# Patient Record
Sex: Female | Born: 2005 | Race: White | Hispanic: No | Marital: Single | Smoking: Former smoker
Health system: Southern US, Community
[De-identification: ages and names within clinical notes are randomized; demographics above are authoritative.]

## PROBLEM LIST (undated history)

## (undated) DIAGNOSIS — J45909 Unspecified asthma, uncomplicated: Secondary | ICD-10-CM

## (undated) DIAGNOSIS — F319 Bipolar disorder, unspecified: Secondary | ICD-10-CM

## (undated) DIAGNOSIS — F32A Depression, unspecified: Secondary | ICD-10-CM

## (undated) DIAGNOSIS — F909 Attention-deficit hyperactivity disorder, unspecified type: Secondary | ICD-10-CM

## (undated) DIAGNOSIS — F419 Anxiety disorder, unspecified: Secondary | ICD-10-CM

## (undated) DIAGNOSIS — F329 Major depressive disorder, single episode, unspecified: Secondary | ICD-10-CM

## (undated) HISTORY — DX: Depression, unspecified: F32.A

## (undated) HISTORY — PX: TYMPANOSTOMY TUBE PLACEMENT: SHX32

## (undated) HISTORY — DX: Major depressive disorder, single episode, unspecified: F32.9

## (undated) HISTORY — PX: TONSILLECTOMY: SUR1361

## (undated) HISTORY — DX: Anxiety disorder, unspecified: F41.9

## (undated) HISTORY — DX: Attention-deficit hyperactivity disorder, unspecified type: F90.9

---
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
## 2023-12-23 ENCOUNTER — Encounter: Payer: Self-pay | Admitting: Plastic Surgery

## 2023-12-23 ENCOUNTER — Ambulatory Visit (INDEPENDENT_AMBULATORY_CARE_PROVIDER_SITE_OTHER): Admitting: Plastic Surgery

## 2023-12-23 VITALS — BP 120/81 | HR 78 | Ht 63.0 in | Wt 212.0 lb

## 2023-12-23 DIAGNOSIS — N62 Hypertrophy of breast: Secondary | ICD-10-CM

## 2023-12-23 DIAGNOSIS — M546 Pain in thoracic spine: Secondary | ICD-10-CM

## 2023-12-23 DIAGNOSIS — M542 Cervicalgia: Secondary | ICD-10-CM | POA: Diagnosis not present

## 2023-12-23 DIAGNOSIS — F4310 Post-traumatic stress disorder, unspecified: Secondary | ICD-10-CM

## 2023-12-23 DIAGNOSIS — Z6837 Body mass index (BMI) 37.0-37.9, adult: Secondary | ICD-10-CM

## 2023-12-23 DIAGNOSIS — G8929 Other chronic pain: Secondary | ICD-10-CM

## 2023-12-23 DIAGNOSIS — M549 Dorsalgia, unspecified: Secondary | ICD-10-CM | POA: Insufficient documentation

## 2023-12-23 NOTE — Progress Notes (Signed)
 Patient ID: Norma Brown, female    DOB: 02-06-2006, 18 y.o.   MRN: [id number]   Chief Complaint  Patient presents with   Consult   Breast Problem    Mammary Hyperplasia: The patient is a 18 y.o. female with a history of mammary hyperplasia for several years.  She has extremely large breasts causing symptoms that include the following: Back pain in the upper and lower back, including neck pain. She pulls or pins her bra straps to provide better lift and relief of the pressure and pain. She notices relief by holding her breast up manually.  Her shoulder straps cause grooves and pain and pressure that requires padding for relief. Pain medication is sometimes required with motrin and tylenol.  Activities that are hindered by enlarged breasts include: exercise and running.  She has tried supportive clothing as well as fitted bras without improvement.  Her breasts are extremely large and fairly symmetric.  She has hyperpigmentation of the inframammary area on both sides.  The sternal to nipple distance on the right is 38 cm and the left is 37 cm.  The IMF distance is 22 cm.  She is 5 feet 3 inches tall and weighs 212 pounds.  The BMI = 37.6 kg/m.  Preoperative bra size = F/G cup.  The estimated excess breast tissue to be removed at the time of surgery = 640 grams on the left and 640 grams on the right.  Mammogram history: none.  Family history of breast cancer:  none.  Tobacco use:  none.   The patient expresses the desire to pursue surgical intervention.  She has had tonsils and wisdom teeth removed without any difficulty.    Review of Systems  Constitutional:  Positive for activity change. Negative for appetite change.  HENT: Negative.    Eyes: Negative.   Respiratory: Negative.    Cardiovascular: Negative.   Gastrointestinal: Negative.   Endocrine: Negative.   Genitourinary: Negative.   Musculoskeletal:  Positive for back pain and neck pain.  Skin:  Positive for rash.    Past  Medical History:  Diagnosis Date   ADHD (attention deficit hyperactivity disorder)    Anxiety    Depression     Past Surgical History:  Procedure Laterality Date   TONSILLECTOMY     TYMPANOSTOMY TUBE PLACEMENT        Current Outpatient Medications:    busPIRone (BUSPAR) 10 MG tablet, Take 1 tablet (10 mg total) by mouth 3 (three) times daily., Disp: 90 tablet, Rfl: 2   citalopram (CELEXA) 20 MG tablet, Take 1.5 tablets (30 mg total) by mouth daily., Disp: 45 tablet, Rfl: 2   lamoTRIgine (LAMICTAL) 25 MG tablet, Take 2 tablets (50 mg total) by mouth at bedtime., Disp: 60 tablet, Rfl: 2   methylphenidate (CONCERTA) 54 MG PO CR tablet, Take 1 tablet (54 mg total) by mouth every morning., Disp: 30 tablet, Rfl: 0   Methylphenidate HCl ER 54 MG TB24, Take 54 mg by mouth every morning., Disp: 30 tablet, Rfl: 0   prazosin (MINIPRESS) 2 MG capsule, Take 1 capsule (2 mg total) by mouth at bedtime., Disp: 30 capsule, Rfl: 2   traZODone (DESYREL) 100 MG tablet, Take 1.5 tablets (150 mg total) by mouth at bedtime., Disp: 45 tablet, Rfl: 2   Objective:   There were no vitals filed for this visit.  Physical Exam Vitals and nursing note reviewed.  Constitutional:      Appearance: Normal appearance.  HENT:  Head: Atraumatic.  Cardiovascular:     Rate and Rhythm: Normal rate.     Pulses: Normal pulses.  Pulmonary:     Effort: Pulmonary effort is normal.  Abdominal:     General: There is no distension.     Palpations: Abdomen is soft.  Musculoskeletal:        General: No swelling or tenderness.  Skin:    General: Skin is warm.     Capillary Refill: Capillary refill takes less than 2 seconds.     Coloration: Skin is not jaundiced.  Neurological:     Mental Status: She is alert and oriented to person, place, and time.  Psychiatric:        Mood and Affect: Mood normal.        Behavior: Behavior normal.        Thought Content: Thought content normal.        Judgment: Judgment normal.      Assessment & Plan:  PTSD (post-traumatic stress disorder)  Symptomatic mammary hypertrophy  Chronic bilateral thoracic back pain  Neck pain  The procedure the patient selected and that was best for the patient was discussed. The risk were discussed and include but not limited to the following:  Breast asymmetry, fluid accumulation, firmness of the breast, inability to breast feed, loss of nipple or areola, skin loss, change in skin and nipple sensation, fat necrosis of the breast tissue, bleeding, infection and healing delay.  There are risks of anesthesia and injury to nerves or blood vessels.  Allergic reaction to tape, suture and skin glue are possible.  There will be swelling.  Any of these can lead to the need for revisional surgery which is not included in this surgery.  A breast reduction has potential to interfere with diagnostic procedures in the future.  This procedure is best done when the breast is fully developed.  Changes in the breast will continue to occur over time: pregnancy, weight gain or weigh loss. No guarantees are given for a certain bra or breast size.    Total time: 40 minutes. This includes time spent with the patient during the visit as well as time spent before and after the visit reviewing the chart, documenting the encounter, ordering pertinent studies and literature for the patient.   Physical therapy: Not required Mammogram: Not required  The patient is a good candidate for bilateral breast reduction with liposuction.  Pictures were obtained of the patient and placed in the chart with the patient's or guardian's permission.   Lindaann Requena Jax Kentner, DO

## 2024-01-28 ENCOUNTER — Institutional Professional Consult (permissible substitution): Payer: Self-pay | Admitting: Plastic Surgery

## 2024-01-30 ENCOUNTER — Encounter: Payer: Self-pay | Admitting: Surgical

## 2024-01-30 ENCOUNTER — Ambulatory Visit (INDEPENDENT_AMBULATORY_CARE_PROVIDER_SITE_OTHER): Admitting: Surgical

## 2024-01-30 VITALS — BP 118/78 | HR 72 | Wt 207.0 lb

## 2024-01-30 DIAGNOSIS — M546 Pain in thoracic spine: Secondary | ICD-10-CM

## 2024-01-30 DIAGNOSIS — M542 Cervicalgia: Secondary | ICD-10-CM

## 2024-01-30 DIAGNOSIS — N62 Hypertrophy of breast: Secondary | ICD-10-CM

## 2024-01-30 DIAGNOSIS — G8929 Other chronic pain: Secondary | ICD-10-CM

## 2024-01-30 NOTE — Progress Notes (Signed)
 Patient ID: Norma Brown, female    DOB: 10-17-2005, 18 y.o.   MRN: [id number]  Chief Complaint  Patient presents with   Pre-op Exam      ICD-10-CM   1. Symptomatic mammary hypertrophy  N62     2. Chronic bilateral thoracic back pain  M54.6    G89.29     3. Neck pain  M54.2      History of Present Illness: Norma Brown is a 18 y.o.  female  with a history of macromastia.  She presents for preoperative evaluation for upcoming procedure, Bilateral Breast Reduction with possible liposuction, scheduled for 02/13/2024 with Dr.  Orin Birk  The patient has not had problems with anesthesia.  She has had dental procedures in the past including wisdom teeth removal and tonsillectomy.  She reports she tolerated these well without any issues.  She is not aware of her family history, she does not know any family history related to blood clots or clotting disorders.  She does not have any personal history of blood clots or clotting disorders that she is aware of.  She is not on any OCPs.  She denies any Crohn's or ulcerative colitis.  She does not report any recent changes to her health.  She denies any cardiac or pulmonary diseases.  Summary of Previous Visit: STN 38 cm on the right, 37 cm on the left.  F/G cup.  Estimated excess breast tissue to be removed at time of surgery: 640 grams  Job: She works at [employer], planning 2 weeks out of work in the return with lifting restrictions.  PMH Significant for: Symptomatic mammary hypertrophy.   Patient reports is not taking any medications at this time, reports she occasionally takes Tylenol or ibuprofen for headaches.  She does report that she smokes marijuana occasionally.  She also reports that she does vape occasionally, reports that she does not vape regularly, but when around others she will occasionally use their vapes.  She reports last use was about 5 days ago.  But denies any regular use.  She reports she is about an F cup and  would like to be much smaller, about a D cup.  She is aware that we cannot guarantee a cup size.  She denies any recent changes to her health.  She has been feeling well lately.  She is here with her aunt who will be assisting her with care afterwards.   Past Medical History: Allergies: Allergies  Allergen Reactions   Acetaminophen-Caff-Pyrilamine Hives   Tape Rash    Surgical tape    Current Medications: Patient reports that she is not taking any current medications.  Past Medical Problems: Past Medical History:  Diagnosis Date   ADHD (attention deficit hyperactivity disorder)    Anxiety    Depression     Past Surgical History: Past Surgical History:  Procedure Laterality Date   TONSILLECTOMY     TYMPANOSTOMY TUBE PLACEMENT      Social History: Social History   Socioeconomic History   Marital status: Single    Spouse name: Not on file   Number of children: Not on file   Years of education: Not on file   Highest education level: Not on file  Occupational History   Not on file  Tobacco Use   Smoking status: Never   Smokeless tobacco: Never  Substance and Sexual Activity   Alcohol use: Not on file   Drug use: Never   Sexual activity: Not  Currently  Other Topics Concern   Not on file  Social History Narrative   Not on file   Social Drivers of Health   Financial Resource Strain: Not on file  Food Insecurity: Not on file  Transportation Needs: Not on file  Physical Activity: Not on file  Stress: Not on file  Social Connections: Not on file  Intimate Partner Violence: Not on file    Family History: Family History  Problem Relation Age of Onset   Drug abuse Mother    Bipolar disorder Mother    Drug abuse Father    Depression Maternal Uncle    Anxiety disorder Cousin    Anxiety disorder Cousin     Review of Systems: Review of Systems  Constitutional: Negative.   Respiratory: Negative.    Cardiovascular: Negative.   Gastrointestinal: Negative.    Musculoskeletal:  Positive for back pain.    Physical Exam: Vital Signs BP 118/78 (BP Location: Left Arm, Patient Position: Sitting, Cuff Size: Normal)   Pulse 72   Wt 207 lb (93.9 kg)   SpO2 100%   BMI 36.67 kg/m   Physical Exam Constitutional:      General: Not in acute distress.    Appearance: Normal appearance. Not ill-appearing.  HENT:     Head: Normocephalic and atraumatic.  Eyes:     Pupils: Pupils are equal, round Neck:     Musculoskeletal: Normal range of motion.  Cardiovascular:     Rate and Rhythm: Normal rate    Pulses: Normal pulses.  Pulmonary:     Effort: Pulmonary effort is normal. No respiratory distress.  Musculoskeletal: Normal range of motion.  Skin:    General: Skin is warm and dry.     Findings: No erythema or rash.  Neurological:     General: No focal deficit present.     Mental Status: Alert and oriented to person, place, and time. Mental status is at baseline.     Motor: No weakness.  Psychiatric:        Mood and Affect: Mood normal.        Behavior: Behavior normal.    Assessment/Plan: The patient is scheduled for bilateral breast reduction with Dr. Orin Birk.  Risks, benefits, and alternatives of procedure discussed, questions answered and consent obtained.    Smoking Status: Reports marijuana use, reports occasional vaping, but is willing to stop vaping between now and surgery and also stop using marijuana; Counseling Given?  Discussed increased risk of postoperative complications related to use of nicotine. Last Mammogram: no hx due to age; Results: n/a  Caprini Score: 4 moderate; Risk Factors include: BMI > 25, use of marijuana, and length of planned surgery. Recommendation for mechanical prophylaxis. Encourage early ambulation.   Pictures obtained: @consult   Post-op Rx sent to pharmacy: No prescription sent at this time, will notify Dr. Orin Birk of patient's nicotine use, may need to be postponed.  Patient was provided with the  breast reduction and General Surgical Risk consent document and Pain Medication Agreement prior to their appointment.  They had adequate time to read through the risk consent documents and Pain Medication Agreement. We also discussed them in person together during this preop appointment. All of their questions were answered to their satisfaction.  Recommended calling if they have any further questions.  Risk consent form and Pain Medication Agreement to be scanned into patient's chart.  The risk that can be encountered with breast reduction were discussed and include the following but not limited to these:  Breast asymmetry, fluid accumulation, firmness of the breast, inability to breast feed, loss of nipple or areola, skin loss, decrease or no nipple sensation, fat necrosis of the breast tissue, bleeding, infection, healing delay.  There are risks of anesthesia, changes to skin sensation and injury to nerves or blood vessels.  The muscle can be temporarily or permanently injured.  You may have an allergic reaction to tape, suture, glue, blood products which can result in skin discoloration, swelling, pain, skin lesions, poor healing.  Any of these can lead to the need for revisonal surgery or stage procedures.  A reduction has potential to interfere with diagnostic procedures.  Nipple or breast piercing can increase risks of infection.  This procedure is best done when the breast is fully developed.  Changes in the breast will continue to occur over time.  Pregnancy can alter the outcomes of previous breast reduction surgery, weight gain and weigh loss can also effect the long term appearance.   We discussed the possibility of amputation/free nipple graft technique due to the length of her STN.  She is understanding of the possibility that we would need to transition from a pedicle technique to a free nipple graft technique intraoperatively.  We discussed the risks associated with free nipple graft breast  reductions, including but not limited to failure of the graft, partial loss of the graft, loss of sensation of bilateral nipple areola, complete loss of the nipple areola graft, inability to breast-feed, postoperative wounds, ongoing wound care.  We also discussed the risks associated with the pedicle technique.  We discussed that with the pedicle technique she could develop nipple areolar necrosis which would result in loss of the nipple, this would also result in ongoing wound care and possible changes in the shape of her breast.   Electronically signed by: Janalyn Me Clarabelle Oscarson, PA-C 01/30/2024 10:50 AM

## 2024-01-30 NOTE — H&P (View-Only) (Signed)
 Patient ID: Norma Brown, female    DOB: 10-17-2005, 18 y.o.   MRN: [id number]  Chief Complaint  Patient presents with   Pre-op Exam      ICD-10-CM   1. Symptomatic mammary hypertrophy  N62     2. Chronic bilateral thoracic back pain  M54.6    G89.29     3. Neck pain  M54.2      History of Present Illness: Norma Brown is a 18 y.o.  female  with a history of macromastia.  She presents for preoperative evaluation for upcoming procedure, Bilateral Breast Reduction with possible liposuction, scheduled for 02/13/2024 with Dr.  Orin Birk  The patient has not had problems with anesthesia.  She has had dental procedures in the past including wisdom teeth removal and tonsillectomy.  She reports she tolerated these well without any issues.  She is not aware of her family history, she does not know any family history related to blood clots or clotting disorders.  She does not have any personal history of blood clots or clotting disorders that she is aware of.  She is not on any OCPs.  She denies any Crohn's or ulcerative colitis.  She does not report any recent changes to her health.  She denies any cardiac or pulmonary diseases.  Summary of Previous Visit: STN 38 cm on the right, 37 cm on the left.  F/G cup.  Estimated excess breast tissue to be removed at time of surgery: 640 grams  Job: She works at [employer], planning 2 weeks out of work in the return with lifting restrictions.  PMH Significant for: Symptomatic mammary hypertrophy.   Patient reports is not taking any medications at this time, reports she occasionally takes Tylenol or ibuprofen for headaches.  She does report that she smokes marijuana occasionally.  She also reports that she does vape occasionally, reports that she does not vape regularly, but when around others she will occasionally use their vapes.  She reports last use was about 5 days ago.  But denies any regular use.  She reports she is about an F cup and  would like to be much smaller, about a D cup.  She is aware that we cannot guarantee a cup size.  She denies any recent changes to her health.  She has been feeling well lately.  She is here with her aunt who will be assisting her with care afterwards.   Past Medical History: Allergies: Allergies  Allergen Reactions   Acetaminophen-Caff-Pyrilamine Hives   Tape Rash    Surgical tape    Current Medications: Patient reports that she is not taking any current medications.  Past Medical Problems: Past Medical History:  Diagnosis Date   ADHD (attention deficit hyperactivity disorder)    Anxiety    Depression     Past Surgical History: Past Surgical History:  Procedure Laterality Date   TONSILLECTOMY     TYMPANOSTOMY TUBE PLACEMENT      Social History: Social History   Socioeconomic History   Marital status: Single    Spouse name: Not on file   Number of children: Not on file   Years of education: Not on file   Highest education level: Not on file  Occupational History   Not on file  Tobacco Use   Smoking status: Never   Smokeless tobacco: Never  Substance and Sexual Activity   Alcohol use: Not on file   Drug use: Never   Sexual activity: Not  Currently  Other Topics Concern   Not on file  Social History Narrative   Not on file   Social Drivers of Health   Financial Resource Strain: Not on file  Food Insecurity: Not on file  Transportation Needs: Not on file  Physical Activity: Not on file  Stress: Not on file  Social Connections: Not on file  Intimate Partner Violence: Not on file    Family History: Family History  Problem Relation Age of Onset   Drug abuse Mother    Bipolar disorder Mother    Drug abuse Father    Depression Maternal Uncle    Anxiety disorder Cousin    Anxiety disorder Cousin     Review of Systems: Review of Systems  Constitutional: Negative.   Respiratory: Negative.    Cardiovascular: Negative.   Gastrointestinal: Negative.    Musculoskeletal:  Positive for back pain.    Physical Exam: Vital Signs BP 118/78 (BP Location: Left Arm, Patient Position: Sitting, Cuff Size: Normal)   Pulse 72   Wt 207 lb (93.9 kg)   SpO2 100%   BMI 36.67 kg/m   Physical Exam Constitutional:      General: Not in acute distress.    Appearance: Normal appearance. Not ill-appearing.  HENT:     Head: Normocephalic and atraumatic.  Eyes:     Pupils: Pupils are equal, round Neck:     Musculoskeletal: Normal range of motion.  Cardiovascular:     Rate and Rhythm: Normal rate    Pulses: Normal pulses.  Pulmonary:     Effort: Pulmonary effort is normal. No respiratory distress.  Musculoskeletal: Normal range of motion.  Skin:    General: Skin is warm and dry.     Findings: No erythema or rash.  Neurological:     General: No focal deficit present.     Mental Status: Alert and oriented to person, place, and time. Mental status is at baseline.     Motor: No weakness.  Psychiatric:        Mood and Affect: Mood normal.        Behavior: Behavior normal.    Assessment/Plan: The patient is scheduled for bilateral breast reduction with Dr. Orin Birk.  Risks, benefits, and alternatives of procedure discussed, questions answered and consent obtained.    Smoking Status: Reports marijuana use, reports occasional vaping, but is willing to stop vaping between now and surgery and also stop using marijuana; Counseling Given?  Discussed increased risk of postoperative complications related to use of nicotine. Last Mammogram: no hx due to age; Results: n/a  Caprini Score: 4 moderate; Risk Factors include: BMI > 25, use of marijuana, and length of planned surgery. Recommendation for mechanical prophylaxis. Encourage early ambulation.   Pictures obtained: @consult   Post-op Rx sent to pharmacy: No prescription sent at this time, will notify Dr. Orin Birk of patient's nicotine use, may need to be postponed.  Patient was provided with the  breast reduction and General Surgical Risk consent document and Pain Medication Agreement prior to their appointment.  They had adequate time to read through the risk consent documents and Pain Medication Agreement. We also discussed them in person together during this preop appointment. All of their questions were answered to their satisfaction.  Recommended calling if they have any further questions.  Risk consent form and Pain Medication Agreement to be scanned into patient's chart.  The risk that can be encountered with breast reduction were discussed and include the following but not limited to these:  Breast asymmetry, fluid accumulation, firmness of the breast, inability to breast feed, loss of nipple or areola, skin loss, decrease or no nipple sensation, fat necrosis of the breast tissue, bleeding, infection, healing delay.  There are risks of anesthesia, changes to skin sensation and injury to nerves or blood vessels.  The muscle can be temporarily or permanently injured.  You may have an allergic reaction to tape, suture, glue, blood products which can result in skin discoloration, swelling, pain, skin lesions, poor healing.  Any of these can lead to the need for revisonal surgery or stage procedures.  A reduction has potential to interfere with diagnostic procedures.  Nipple or breast piercing can increase risks of infection.  This procedure is best done when the breast is fully developed.  Changes in the breast will continue to occur over time.  Pregnancy can alter the outcomes of previous breast reduction surgery, weight gain and weigh loss can also effect the long term appearance.   We discussed the possibility of amputation/free nipple graft technique due to the length of her STN.  She is understanding of the possibility that we would need to transition from a pedicle technique to a free nipple graft technique intraoperatively.  We discussed the risks associated with free nipple graft breast  reductions, including but not limited to failure of the graft, partial loss of the graft, loss of sensation of bilateral nipple areola, complete loss of the nipple areola graft, inability to breast-feed, postoperative wounds, ongoing wound care.  We also discussed the risks associated with the pedicle technique.  We discussed that with the pedicle technique she could develop nipple areolar necrosis which would result in loss of the nipple, this would also result in ongoing wound care and possible changes in the shape of her breast.   Electronically signed by: Janalyn Me Clarabelle Oscarson, PA-C 01/30/2024 10:50 AM

## 2024-02-07 ENCOUNTER — Other Ambulatory Visit: Payer: Self-pay

## 2024-02-07 ENCOUNTER — Encounter (HOSPITAL_BASED_OUTPATIENT_CLINIC_OR_DEPARTMENT_OTHER): Payer: Self-pay | Admitting: Plastic Surgery

## 2024-02-12 NOTE — Anesthesia Preprocedure Evaluation (Addendum)
 Anesthesia Evaluation  Patient identified by MRN, date of birth, ID band Patient awake    Reviewed: Allergy & Precautions, NPO status , Patient's Chart, lab work & pertinent test results  History of Anesthesia Complications Negative for: history of anesthetic complications  Airway Mallampati: II  TM Distance: >3 FB Neck ROM: Full    Dental  (+) Dental Advisory Given   Pulmonary neg pulmonary ROS   breath sounds clear to auscultation       Cardiovascular negative cardio ROS  Rhythm:Regular Rate:Normal     Neuro/Psych  PSYCHIATRIC DISORDERS (ADHD) Anxiety Depression Bipolar Disorder   negative neurological ROS     GI/Hepatic negative GI ROS, Neg liver ROS,,,  Endo/Other  negative endocrine ROS    Renal/GU negative Renal ROS     Musculoskeletal   Abdominal  (+) + obese  Peds  Hematology negative hematology ROS (+)   Anesthesia Other Findings   Reproductive/Obstetrics                             Anesthesia Physical Anesthesia Plan  ASA: 2  Anesthesia Plan: General   Post-op Pain Management:    Induction: Intravenous  PONV Risk Score and Plan: 4 or greater and Ondansetron, Dexamethasone, Midazolam, Scopolamine patch - Pre-op and Treatment may vary due to age or medical condition  Airway Management Planned: Oral ETT  Additional Equipment:   Intra-op Plan:   Post-operative Plan: Extubation in OR  Informed Consent: I have reviewed the patients History and Physical, chart, labs and discussed the procedure including the risks, benefits and alternatives for the proposed anesthesia with the patient or authorized representative who has indicated his/her understanding and acceptance.     Dental advisory given  Plan Discussed with: CRNA and Anesthesiologist  Anesthesia Plan Comments: (Risks of general anesthesia discussed including, but not limited to, sore throat, hoarse voice,  chipped/damaged teeth, injury to vocal cords, nausea and vomiting, allergic reactions, lung infection, heart attack, stroke, and death. All questions answered. )        Anesthesia Quick Evaluation

## 2024-02-13 ENCOUNTER — Other Ambulatory Visit: Payer: Self-pay

## 2024-02-13 ENCOUNTER — Ambulatory Visit (HOSPITAL_BASED_OUTPATIENT_CLINIC_OR_DEPARTMENT_OTHER)
Admission: RE | Admit: 2024-02-13 | Discharge: 2024-02-13 | Disposition: A | Discharge status: Home / Self Care | Attending: Plastic Surgery | Admitting: Plastic Surgery

## 2024-02-13 ENCOUNTER — Ambulatory Visit (HOSPITAL_BASED_OUTPATIENT_CLINIC_OR_DEPARTMENT_OTHER): Payer: Self-pay | Admitting: Anesthesiology

## 2024-02-13 ENCOUNTER — Encounter (HOSPITAL_BASED_OUTPATIENT_CLINIC_OR_DEPARTMENT_OTHER): Payer: Self-pay | Admitting: Plastic Surgery

## 2024-02-13 ENCOUNTER — Encounter (HOSPITAL_BASED_OUTPATIENT_CLINIC_OR_DEPARTMENT_OTHER)
Admission: RE | Disposition: A | Discharge status: Home / Self Care | Payer: Self-pay | Source: Home / Self Care | Attending: Plastic Surgery

## 2024-02-13 DIAGNOSIS — M549 Dorsalgia, unspecified: Secondary | ICD-10-CM | POA: Diagnosis not present

## 2024-02-13 DIAGNOSIS — N62 Hypertrophy of breast: Secondary | ICD-10-CM

## 2024-02-13 DIAGNOSIS — Z01818 Encounter for other preprocedural examination: Secondary | ICD-10-CM

## 2024-02-13 DIAGNOSIS — M542 Cervicalgia: Secondary | ICD-10-CM | POA: Insufficient documentation

## 2024-02-13 HISTORY — DX: Bipolar disorder, unspecified: F31.9

## 2024-02-13 HISTORY — PX: BREAST REDUCTION SURGERY: SHX8

## 2024-02-13 LAB — POCT PREGNANCY, URINE: Preg Test, Ur: NEGATIVE

## 2024-02-13 SURGERY — BREAST REDUCTION WITH LIPOSUCTION
Anesthesia: General | Site: Breast | Laterality: Bilateral

## 2024-02-13 MED ORDER — DEXMEDETOMIDINE HCL IN NACL 80 MCG/20ML IV SOLN
INTRAVENOUS | Status: AC
  Filled 2024-02-13: qty 20

## 2024-02-13 MED ORDER — OXYCODONE HCL 5 MG PO TABS: 5.0000 mg | ORAL_TABLET | ORAL | Status: DC | PRN

## 2024-02-13 MED ORDER — FENTANYL CITRATE (PF) 100 MCG/2ML IJ SOLN
INTRAMUSCULAR | Status: AC
  Filled 2024-02-13: qty 2

## 2024-02-13 MED ORDER — OXYCODONE HCL 5 MG PO TABS: 5.0000 mg | ORAL_TABLET | Freq: Three times a day (TID) | ORAL | 0 refills | Status: AC | PRN

## 2024-02-13 MED ORDER — EPHEDRINE 5 MG/ML INJ
INTRAVENOUS | Status: AC
  Filled 2024-02-13: qty 5

## 2024-02-13 MED ORDER — FENTANYL CITRATE (PF) 100 MCG/2ML IJ SOLN: 25.0000 ug | INTRAMUSCULAR | Status: DC | PRN

## 2024-02-13 MED ORDER — CHLORHEXIDINE GLUCONATE CLOTH 2 % EX PADS: 6.0000 | MEDICATED_PAD | Freq: Once | CUTANEOUS | Status: DC

## 2024-02-13 MED ORDER — SODIUM CHLORIDE 0.9 % IV SOLN
INTRAVENOUS | Status: DC | PRN
  Administered 2024-02-13: 40 mL

## 2024-02-13 MED ORDER — LIDOCAINE 2% (20 MG/ML) 5 ML SYRINGE
INTRAMUSCULAR | Status: AC
  Filled 2024-02-13: qty 5

## 2024-02-13 MED ORDER — VASHE WOUND IRRIGATION OPTIME
TOPICAL | Status: DC | PRN
Start: 2024-02-13 — End: 2024-02-13
  Administered 2024-02-13: 34 [oz_av] via TOPICAL

## 2024-02-13 MED ORDER — DEXMEDETOMIDINE HCL IN NACL 80 MCG/20ML IV SOLN
INTRAVENOUS | Status: DC | PRN
  Administered 2024-02-13: 16 ug via INTRAVENOUS

## 2024-02-13 MED ORDER — BUPIVACAINE LIPOSOME 1.3 % IJ SUSP
INTRAMUSCULAR | Status: AC
  Filled 2024-02-13: qty 20

## 2024-02-13 MED ORDER — ATROPINE SULFATE 0.4 MG/ML IV SOLN
INTRAVENOUS | Status: AC
  Filled 2024-02-13: qty 1

## 2024-02-13 MED ORDER — ONDANSETRON HCL 4 MG/2ML IJ SOLN
INTRAMUSCULAR | Status: AC
  Filled 2024-02-13: qty 2

## 2024-02-13 MED ORDER — AMISULPRIDE (ANTIEMETIC) 5 MG/2ML IV SOLN
INTRAVENOUS | Status: AC
  Filled 2024-02-13: qty 2

## 2024-02-13 MED ORDER — HYDROMORPHONE HCL 1 MG/ML IJ SOLN
INTRAMUSCULAR | Status: DC | PRN
  Administered 2024-02-13 (×2): .5 mg via INTRAVENOUS

## 2024-02-13 MED ORDER — BUPIVACAINE HCL (PF) 0.25 % IJ SOLN
INTRAMUSCULAR | Status: AC
  Filled 2024-02-13: qty 30

## 2024-02-13 MED ORDER — CEFAZOLIN SODIUM-DEXTROSE 2-4 GM/100ML-% IV SOLN
2.0000 g | INTRAVENOUS | Status: AC
  Administered 2024-02-13: 2 g via INTRAVENOUS

## 2024-02-13 MED ORDER — HYDROMORPHONE HCL 1 MG/ML IJ SOLN
INTRAMUSCULAR | Status: AC
  Filled 2024-02-13: qty 0.5

## 2024-02-13 MED ORDER — OXYCODONE HCL 5 MG PO TABS: 5.0000 mg | ORAL_TABLET | Freq: Once | ORAL | Status: DC | PRN

## 2024-02-13 MED ORDER — SUGAMMADEX SODIUM 200 MG/2ML IV SOLN
INTRAVENOUS | Status: DC | PRN
  Administered 2024-02-13: 200 mg via INTRAVENOUS

## 2024-02-13 MED ORDER — PROPOFOL 10 MG/ML IV BOLUS
INTRAVENOUS | Status: DC | PRN
  Administered 2024-02-13: 200 mg via INTRAVENOUS

## 2024-02-13 MED ORDER — OXYCODONE HCL 5 MG/5ML PO SOLN: 5.0000 mg | Freq: Once | ORAL | Status: DC | PRN

## 2024-02-13 MED ORDER — ROCURONIUM BROMIDE 10 MG/ML (PF) SYRINGE
PREFILLED_SYRINGE | INTRAVENOUS | Status: AC
  Filled 2024-02-13: qty 10

## 2024-02-13 MED ORDER — ONDANSETRON 4 MG PO TBDP: 4.0000 mg | ORAL_TABLET | Freq: Three times a day (TID) | ORAL | 0 refills | Status: AC | PRN

## 2024-02-13 MED ORDER — SODIUM CHLORIDE 0.9% FLUSH: 3.0000 mL | Freq: Two times a day (BID) | INTRAVENOUS | Status: DC

## 2024-02-13 MED ORDER — SODIUM CHLORIDE 0.9% FLUSH: 3.0000 mL | INTRAVENOUS | Status: DC | PRN

## 2024-02-13 MED ORDER — PHENYLEPHRINE 80 MCG/ML (10ML) SYRINGE FOR IV PUSH (FOR BLOOD PRESSURE SUPPORT)
PREFILLED_SYRINGE | INTRAVENOUS | Status: AC
  Filled 2024-02-13: qty 10

## 2024-02-13 MED ORDER — LIDOCAINE HCL 1 % IJ SOLN
INTRAVENOUS | Status: DC | PRN
  Administered 2024-02-13: 500 mL

## 2024-02-13 MED ORDER — MIDAZOLAM HCL 2 MG/2ML IJ SOLN
INTRAMUSCULAR | Status: AC
  Filled 2024-02-13: qty 2

## 2024-02-13 MED ORDER — CEFAZOLIN SODIUM-DEXTROSE 2-4 GM/100ML-% IV SOLN
INTRAVENOUS | Status: AC
  Filled 2024-02-13: qty 100

## 2024-02-13 MED ORDER — LIDOCAINE HCL (CARDIAC) PF 100 MG/5ML IV SOSY
PREFILLED_SYRINGE | INTRAVENOUS | Status: DC | PRN
  Administered 2024-02-13: 100 mg via INTRAVENOUS

## 2024-02-13 MED ORDER — LIDOCAINE-EPINEPHRINE 1 %-1:100000 IJ SOLN
INTRAMUSCULAR | Status: DC | PRN
  Administered 2024-02-13: 50 mL via INTRAMUSCULAR

## 2024-02-13 MED ORDER — LACTATED RINGERS IV SOLN: INTRAVENOUS | Status: DC

## 2024-02-13 MED ORDER — ROCURONIUM BROMIDE 100 MG/10ML IV SOLN
INTRAVENOUS | Status: DC | PRN
  Administered 2024-02-13: 60 mg via INTRAVENOUS

## 2024-02-13 MED ORDER — DEXAMETHASONE SODIUM PHOSPHATE 4 MG/ML IJ SOLN
INTRAMUSCULAR | Status: DC | PRN
  Administered 2024-02-13: 5 mg via INTRAVENOUS

## 2024-02-13 MED ORDER — SODIUM CHLORIDE 0.9 % IV SOLN: 250.0000 mL | INTRAVENOUS | Status: DC | PRN

## 2024-02-13 MED ORDER — SUCCINYLCHOLINE CHLORIDE 200 MG/10ML IV SOSY
PREFILLED_SYRINGE | INTRAVENOUS | Status: AC
  Filled 2024-02-13: qty 10

## 2024-02-13 MED ORDER — SCOPOLAMINE 1 MG/3DAYS TD PT72
1.0000 | MEDICATED_PATCH | TRANSDERMAL | Status: DC
  Administered 2024-02-13: 1.5 mg via TRANSDERMAL

## 2024-02-13 MED ORDER — DEXAMETHASONE SODIUM PHOSPHATE 10 MG/ML IJ SOLN
INTRAMUSCULAR | Status: AC
  Filled 2024-02-13: qty 1

## 2024-02-13 MED ORDER — FENTANYL CITRATE (PF) 100 MCG/2ML IJ SOLN
INTRAMUSCULAR | Status: DC | PRN
  Administered 2024-02-13: 50 ug via INTRAVENOUS
  Administered 2024-02-13: 100 ug via INTRAVENOUS
  Administered 2024-02-13: 50 ug via INTRAVENOUS

## 2024-02-13 MED ORDER — SCOPOLAMINE 1 MG/3DAYS TD PT72
MEDICATED_PATCH | TRANSDERMAL | Status: AC
  Filled 2024-02-13: qty 1

## 2024-02-13 MED ORDER — AMISULPRIDE (ANTIEMETIC) 5 MG/2ML IV SOLN
10.0000 mg | Freq: Once | INTRAVENOUS | Status: AC | PRN
  Administered 2024-02-13: 10 mg via INTRAVENOUS

## 2024-02-13 MED ORDER — CEPHALEXIN 500 MG PO CAPS: 500.0000 mg | ORAL_CAPSULE | Freq: Four times a day (QID) | ORAL | 0 refills | Status: AC

## 2024-02-13 MED ORDER — MIDAZOLAM HCL 5 MG/5ML IJ SOLN
INTRAMUSCULAR | Status: DC | PRN
  Administered 2024-02-13: 2 mg via INTRAVENOUS

## 2024-02-13 SURGICAL SUPPLY — 62 items
BINDER BREAST LRG (GAUZE/BANDAGES/DRESSINGS) IMPLANT
BINDER BREAST MEDIUM (GAUZE/BANDAGES/DRESSINGS) IMPLANT
BINDER BREAST XLRG (GAUZE/BANDAGES/DRESSINGS) IMPLANT
BINDER BREAST XXLRG (GAUZE/BANDAGES/DRESSINGS) IMPLANT
BIOPATCH RED 1 DISK 7.0 (GAUZE/BANDAGES/DRESSINGS) IMPLANT
BLADE HEX COATED 2.75 (ELECTRODE) IMPLANT
BLADE KNIFE PERSONA 10 (BLADE) ×2 IMPLANT
BLADE SURG 15 STRL LF DISP TIS (BLADE) ×1 IMPLANT
CANISTER SUCT 1200ML W/VALVE (MISCELLANEOUS) ×1 IMPLANT
CLEANSER WND VASHE 34 (WOUND CARE) ×1 IMPLANT
COVER BACK TABLE 60X90IN (DRAPES) ×1 IMPLANT
COVER MAYO STAND STRL (DRAPES) ×1 IMPLANT
DERMABOND ADVANCED .7 DNX12 (GAUZE/BANDAGES/DRESSINGS) ×2 IMPLANT
DRAIN CHANNEL 15F RND FF W/TCR (WOUND CARE) IMPLANT
DRAIN CHANNEL 19F RND (DRAIN) IMPLANT
DRAPE LAPAROSCOPIC ABDOMINAL (DRAPES) ×1 IMPLANT
DRAPE UTILITY XL STRL (DRAPES) ×1 IMPLANT
DRSG MEPILEX POST OP 4X8 (GAUZE/BANDAGES/DRESSINGS) ×2 IMPLANT
DRSG TEGADERM 4X4.75 (GAUZE/BANDAGES/DRESSINGS) IMPLANT
ELECTRODE BLDE 4.0 EZ CLN MEGD (MISCELLANEOUS) ×1 IMPLANT
ELECTRODE REM PT RTRN 9FT ADLT (ELECTROSURGICAL) ×1 IMPLANT
EVACUATOR SILICONE 100CC (DRAIN) IMPLANT
GAUZE PAD ABD 8X10 STRL (GAUZE/BANDAGES/DRESSINGS) ×2 IMPLANT
GLOVE BIO SURGEON STRL SZ 6.5 (GLOVE) ×2 IMPLANT
GLOVE BIO SURGEON STRL SZ7.5 (GLOVE) ×1 IMPLANT
GLOVE BIOGEL PI IND STRL 7.0 (GLOVE) IMPLANT
GLOVE BIOGEL PI IND STRL 8 (GLOVE) IMPLANT
GLOVE SURG SS PI 6.5 STRL IVOR (GLOVE) IMPLANT
GOWN STRL REUS W/ TWL LRG LVL3 (GOWN DISPOSABLE) ×2 IMPLANT
GOWN STRL REUS W/ TWL XL LVL3 (GOWN DISPOSABLE) IMPLANT
LINER CANISTER 1000CC FLEX (MISCELLANEOUS) ×1 IMPLANT
NDL FILTER BLUNT 18X1 1/2 (NEEDLE) IMPLANT
NDL HYPO 25X1 1.5 SAFETY (NEEDLE) ×2 IMPLANT
NEEDLE FILTER BLUNT 18X1 1/2 (NEEDLE) ×1 IMPLANT
NEEDLE HYPO 25X1 1.5 SAFETY (NEEDLE) ×2 IMPLANT
NS IRRIG 1000ML POUR BTL (IV SOLUTION) IMPLANT
PACK BASIN DAY SURGERY FS (CUSTOM PROCEDURE TRAY) ×1 IMPLANT
PAD ALCOHOL SWAB (MISCELLANEOUS) IMPLANT
PAD FOAM SILICONE BACKED (GAUZE/BANDAGES/DRESSINGS) IMPLANT
PENCIL SMOKE EVACUATOR (MISCELLANEOUS) ×1 IMPLANT
PIN SAFETY STERILE (MISCELLANEOUS) IMPLANT
POWDER MYRIAD MORCLLS FINE 500 (Miscellaneous) IMPLANT
SLEEVE SCD COMPRESS KNEE MED (STOCKING) ×1 IMPLANT
SPIKE FLUID TRANSFER (MISCELLANEOUS) IMPLANT
SPONGE T-LAP 18X18 ~~LOC~~+RFID (SPONGE) ×2 IMPLANT
STRIP SUTURE WOUND CLOSURE 1/2 (MISCELLANEOUS) ×4 IMPLANT
SUT MNCRL AB 4-0 PS2 18 (SUTURE) ×4 IMPLANT
SUT MON AB 3-0 SH27 (SUTURE) ×4 IMPLANT
SUT MON AB 5-0 PS2 18 (SUTURE) IMPLANT
SUT PDS II 3-0 CT2 27 ABS (SUTURE) ×4 IMPLANT
SUT SILK 3 0 PS 1 (SUTURE) IMPLANT
SYR 50ML LL SCALE MARK (SYRINGE) IMPLANT
SYR BULB IRRIG 60ML STRL (SYRINGE) ×1 IMPLANT
SYR CONTROL 10ML LL (SYRINGE) ×2 IMPLANT
TAPE MEASURE VINYL STERILE (MISCELLANEOUS) IMPLANT
TOWEL GREEN STERILE FF (TOWEL DISPOSABLE) ×3 IMPLANT
TRAY DSU PREP LF (CUSTOM PROCEDURE TRAY) ×1 IMPLANT
TUBE CONNECTING 20X1/4 (TUBING) ×1 IMPLANT
TUBING INFILTRATION IT-10001 (TUBING) IMPLANT
TUBING SET GRADUATE ASPIR 12FT (MISCELLANEOUS) IMPLANT
UNDERPAD 30X36 HEAVY ABSORB (UNDERPADS AND DIAPERS) ×2 IMPLANT
YANKAUER SUCT BULB TIP NO VENT (SUCTIONS) ×1 IMPLANT

## 2024-02-13 NOTE — Discharge Instructions (Addendum)
 INSTRUCTIONS FOR AFTER BREAST SURGERY   You will likely have some questions about what to expect following your operation.  The following information will help you and your family understand what to expect when you are discharged from the hospital.  It is important to follow these guidelines to help ensure a smooth recovery and reduce complication.  Postoperative instructions include information on: diet, wound care, medications and physical activity.  AFTER SURGERY Expect to go home after the procedure.  In some cases, you may need to spend one night in the hospital for observation.  DIET Breast surgery does not require a specific diet.  However, the healthier you eat the better your body will heal. It is important to increasing your protein intake.  This means limiting the foods with sugar and carbohydrates.  Focus on vegetables and some meat.  If you have liposuction during your procedure be sure to drink water.  If your urine is bright yellow, then it is concentrated, and you need to drink more water.  As a general rule after surgery, you should have 8 ounces of water every hour while awake.  If you find you are persistently nauseated or unable to take in liquids let us  know.  NO TOBACCO USE or EXPOSURE.  This will slow your healing process and lead to a wound.  WOUND CARE Leave the binder on for 3 days . Use fragrance free soap like Dial, Dove or [place].   After 3 days you can remove the binder to shower. Once dry apply binder or sports bra. If you have liposuction you will have a soft and spongy dressing (Lipofoam) that helps prevent creases in your skin.  Remove before you shower and then replace it.  It is also available on [employer]. If you have steri-strips / tape directly attached to your skin leave them in place. It is OK to get these wet.   No baths, pools or hot tubs for four weeks. We close your incision to leave the smallest and best-looking scar. No ointment or creams on your incisions  for four weeks.  No Neosporin (Too many skin reactions).  A few weeks after surgery you can use Mederma and start massaging the scar. We ask you to wear your binder or sports bra for the first 6 weeks around the clock, including while sleeping. This provides added comfort and helps reduce the fluid accumulation at the surgery site. NO Ice or heating pads to the operative site.  You have a very high risk of a BURN before you feel the temperature change.  ACTIVITY No heavy lifting until cleared by the doctor.  This usually means no more than a half-gallon of milk.  It is OK to walk and climb stairs. Moving your legs is very important to decrease your risk of a blood clot.  It will also help keep you from getting deconditioned.  Every 1 to 2 hours get up and walk for 5 minutes. This will help with a quicker recovery back to normal.  Let pain be your guide so you don't do too much.  This time is for you to recover.  You will be more comfortable if you sleep and rest with your head elevated either with a few pillows under you or in a recliner.  No stomach sleeping for a three months.  WORK Everyone returns to work at different times. As a rough guide, most people take at least 1 - 2 weeks off prior to returning to work. If  you need documentation for your job, give the forms to the front staff at the clinic.  DRIVING Arrange for someone to bring you home from the hospital after your surgery.  You may be able to drive a few days after surgery but not while taking any narcotics or valium.  BOWEL MOVEMENTS Constipation can occur after anesthesia and while taking pain medication.  It is important to stay ahead for your comfort.  We recommend taking Milk of Magnesia (2 tablespoons; twice a day) while taking the pain pills.  MEDICATIONS You may be prescribed should start after surgery At your preoperative visit for you history and physical you may have been given the following medications: An antibiotic: Start  this medication when you get home and take according to the instructions on the bottle. Zofran 4 mg:  This is to treat nausea and vomiting.  You can take this every 6 hours as needed and only if needed. Valium 2 mg for breast cancer patients: This is for muscle tightness if you have an implant or expander. This will help relax your muscle which also helps with pain control.  This can be taken every 12 hours as needed. Don't drive after taking this medication. Norco (hydrocodone/acetaminophen) 5/325 mg:  This is only to be used after you have taken the Motrin or the Tylenol. Every 8 hours as needed.   Over the counter Medication to take: Ibuprofen (Motrin) 600 mg:  Take this every 6 hours.  If you have additional pain then take 500 mg of the Tylenol every 8 hours.  Only take the Norco after you have tried these two. MiraLAX or Milk of Magnesia: Take this according to the bottle if you take the Norco.  WHEN TO CALL Call your surgeon's office if any of the following occur: Fever 101 degrees F or greater Excessive bleeding or fluid from the incision site. Pain that increases over time without aid from the medications Redness, warmth, or pus draining from incision sites Persistent nausea or inability to take in liquids Severe misshapen area that underwent the operation.  Information for Discharge Teaching: EXPAREL (bupivacaine liposome injectable suspension)   Pain relief is important to your recovery. The goal is to control your pain so you can move easier and return to your normal activities as soon as possible after your procedure. Your physician may use several types of medicines to manage pain, swelling, and more.  Your surgeon or anesthesiologist gave you EXPAREL(bupivacaine) to help control your pain after surgery.  EXPAREL is a local anesthetic designed to release slowly over an extended period of time to provide pain relief by numbing the tissue around the surgical site. EXPAREL is  designed to release pain medication over time and can control pain for up to 72 hours. Depending on how you respond to EXPAREL, you may require less pain medication during your recovery. EXPAREL can help reduce or eliminate the need for opioids during the first few days after surgery when pain relief is needed the most. EXPAREL is not an opioid and is not addictive. It does not cause sleepiness or sedation.   Important! A teal colored band has been placed on your arm with the date, time and amount of EXPAREL you have received. Please leave this armband in place for the full 96 hours following administration, and then you may remove the band. If you return to the hospital for any reason within 96 hours following the administration of EXPAREL, the armband provides important information that your  health care providers to know, and alerts them that you have received this anesthetic.    Possible side effects of EXPAREL: Temporary loss of sensation or ability to move in the area where medication was injected. Nausea, vomiting, constipation Rarely, numbness and tingling in your mouth or lips, lightheadedness, or anxiety may occur. Call your doctor right away if you think you may be experiencing any of these sensations, or if you have other questions regarding possible side effects.  Follow all other discharge instructions given to you by your surgeon or nurse. Eat a healthy diet and drink plenty of water or other fluids.

## 2024-02-13 NOTE — Interval H&P Note (Signed)
 History and Physical Interval Note:  02/13/2024 11:36 AM  Derrell Flight  has presented today for surgery, with the diagnosis of macromastia.  The various methods of treatment have been discussed with the patient and family. After consideration of risks, benefits and other options for treatment, the patient has consented to  Procedure(s): BREAST REDUCTION WITH LIPOSUCTION (Bilateral) as a surgical intervention.  The patient's history has been reviewed, patient examined, no change in status, stable for surgery.  I have reviewed the patient's chart and labs.  Questions were answered to the patient's satisfaction.     Lindaann Requena Kimorah Ridolfi

## 2024-02-13 NOTE — Anesthesia Procedure Notes (Signed)
 Procedure Name: Intubation Date/Time: 02/13/2024 11:44 AM  Performed by: Eugenia Hess, CRNAPre-anesthesia Checklist: Patient identified, Emergency Drugs available, Suction available and Patient being monitored Patient Re-evaluated:Patient Re-evaluated prior to induction Oxygen Delivery Method: Circle system utilized Preoxygenation: Pre-oxygenation with 100% oxygen Induction Type: IV induction Ventilation: Mask ventilation without difficulty Laryngoscope Size: Mac and 3 Grade View: Grade I Tube type: Oral Number of attempts: 1 Airway Equipment and Method: Stylet and Oral airway Placement Confirmation: ETT inserted through vocal cords under direct vision, positive ETCO2 and breath sounds checked- equal and bilateral Secured at: 22 cm Tube secured with: Tape Dental Injury: Teeth and Oropharynx as per pre-operative assessment

## 2024-02-13 NOTE — Transfer of Care (Signed)
 Immediate Anesthesia Transfer of Care Note  Patient: Norma Brown  Procedure(s) Performed: BILATERAL BREAST REDUCTION WITH LIPOSUCTION (Bilateral: Breast)  Patient Location: PACU  Anesthesia Type:General  Level of Consciousness: awake, alert , and oriented  Airway & Oxygen Therapy: Patient Spontanous Breathing and Patient connected to face mask oxygen  Post-op Assessment: Report given to RN and Post -op Vital signs reviewed and stable  Post vital signs: Reviewed and stable  Last Vitals:  Vitals Value Taken Time  BP 133/90 02/13/24 1407  Temp    Pulse 117 02/13/24 1410  Resp 18 02/13/24 1410  SpO2 98 % 02/13/24 1410  Vitals shown include unfiled device data.  Last Pain:  Vitals:   02/13/24 0933  TempSrc: Temporal  PainSc: 0-No pain      Patients Stated Pain Goal: 5 (02/13/24 0933)  Complications: No notable events documented.

## 2024-02-13 NOTE — Anesthesia Postprocedure Evaluation (Signed)
 Anesthesia Post Note  Patient: Norma Brown  Procedure(s) Performed: BILATERAL BREAST REDUCTION WITH LIPOSUCTION (Bilateral: Breast)     Patient location during evaluation: PACU Anesthesia Type: General Level of consciousness: awake and alert Pain management: pain level controlled Vital Signs Assessment: post-procedure vital signs reviewed and stable Respiratory status: spontaneous breathing, nonlabored ventilation and respiratory function stable Cardiovascular status: blood pressure returned to baseline Postop Assessment: no apparent nausea or vomiting Anesthetic complications: no   No notable events documented.  Last Vitals:  Vitals:   02/13/24 1415 02/13/24 1430  BP: 121/76 112/66  Pulse: (!) 103 83  Resp: 20 17  Temp: 36.6 C   SpO2: 97% 99%    Last Pain:  Vitals:   02/13/24 1415  TempSrc:   PainSc: 0-No pain                 Rayfield Cairo

## 2024-02-13 NOTE — Op Note (Signed)
 Breast Reduction Op note:    DATE OF PROCEDURE: 02/13/2024  LOCATION: [facility]  SURGEON: Gilles Lacks, DO  ASSISTANT: Mariel Shope, PA  PREOPERATIVE DIAGNOSIS 1. Macromastia 2. Neck Pain 3. Back Pain  POSTOPERATIVE DIAGNOSIS 1. Macromastia 2. Neck Pain 3. Back Pain  PROCEDURES 1. Bilateral breast reduction.  Right reduction 1150 g, Left reduction 1075 g  COMPLICATIONS: None.  DRAINS: none  INDICATIONS FOR PROCEDURE Norma Brown is a 18 y.o. year-old female born on 29-Dec-2005,with a history of symptomatic macromastia with concomitant back pain, neck pain, shoulder grooving from her bra.   MRN: [id number]  CONSENT Informed consent was obtained directly from the patient. The risks, benefits and alternatives were fully discussed. Specific risks including but not limited to bleeding, infection, hematoma, seroma, scarring, pain, nipple necrosis, asymmetry, poor cosmetic results, and need for further surgery were discussed. The patient's questions were answered.  DESCRIPTION OF PROCEDURE  Patient was brought into the operating room and rested on the operating room table in the supine position.  SCDs were placed and appropriate padding was performed.  Antibiotics were given. The patient underwent general anesthesia and the chest was prepped and draped in a sterile fashion.  A timeout was performed and all information was confirmed to be correct by those in the room. Tumescent was placed in the lateral breast and liposuction was done laterally on each side for improved symmetry.   Right side: Preoperative markings were confirmed.  Incision lines were injected with local containing epinephrine.  After waiting for vasoconstriction, the marked lines were incised with a #15 blade.  A Wise-pattern superomedial breast reduction was performed by de-epithelializing the pedicle, using bovie to create the superomedial pedicle, and removing breast tissue from the  superior, lateral, and inferior portions of the breast.  Care was taken to not undermine the breast pedicle. Hemostasis was achieved. Experel and Myriad were placed in the pocket.  The nipple was gently rotated into position and the soft tissue closed with 4-0 Monocryl.   The pocket was irrigated and hemostasis confirmed.  The deep tissues were approximated with 3-0 PDS sutures.  The skin was closed with deep dermal 3-0 Monocryl and subcuticular 4-0 Monocryl sutures.  The nipple and skin flaps had good capillary refill at the end of the procedure.    Left side: Preoperative markings were confirmed.  Incision lines were injected with local containing epinephrine.  After waiting for vasoconstriction, the marked lines were incised with a #15 blade.  A Wise-pattern superomedial breast reduction was performed by de-epithelializing the pedicle, using bovie to create the superomedial pedicle, and removing breast tissue from the superior, lateral, and inferior portions of the breast.  Care was taken to not undermine the breast pedicle. Hemostasis was achieved. Experel and Myriad were placed in the pocket.  The nipple was gently rotated into position and the soft tissue was closed with 4-0 Monocryl.  The patient was sat upright and size and shape symmetry was confirmed.  The pocket was irrigated and hemostasis confirmed.  The deep tissues were approximated with 3-0 PDS sutures. The skin was closed with deep dermal 3-0 Monocryl and subcuticular 4-0 Monocryl sutures.  Dermabond was applied.  A breast binder and ABDs were placed.  The nipple and skin flaps had good capillary refill at the end of the procedure.  The patient tolerated the procedure well. The patient was allowed to wake from anesthesia and taken to the recovery room in satisfactory condition.  The advanced practice practitioner (  APP) assisted throughout the case.  The APP was essential in retraction and counter traction when needed to make the case progress  smoothly.  This retraction and assistance made it possible to see the tissue plans for the procedure.  The assistance was needed for blood control, tissue re-approximation and assisted with closure of the incision site.

## 2024-02-14 ENCOUNTER — Encounter (HOSPITAL_BASED_OUTPATIENT_CLINIC_OR_DEPARTMENT_OTHER): Payer: Self-pay | Admitting: Plastic Surgery

## 2024-02-14 LAB — SURGICAL PATHOLOGY

## 2024-02-17 ENCOUNTER — Telehealth: Payer: Self-pay | Admitting: Plastic Surgery

## 2024-02-17 ENCOUNTER — Telehealth: Payer: Self-pay | Admitting: Student

## 2024-02-17 NOTE — Telephone Encounter (Signed)
 underneath R breast she is experiencing a ton of pain and is bleeding

## 2024-02-17 NOTE — Telephone Encounter (Signed)
 err

## 2024-02-17 NOTE — Telephone Encounter (Signed)
 Called patient. Will have her come in today

## 2024-02-17 NOTE — Telephone Encounter (Signed)
 Patient unable to make it to the clinic today.  She states that she took off her Mepilex border dressing and it was saturated with drainage.  She states the drainage was reddish in color.  She reports that since then, she has had a little bit of drainage.  She denies any fevers or chills.  She denies any nausea or vomiting.  She denies any lightheadedness or dizziness.  She reports she is ambulating without issue.  She reports that she has been eating and drinking without any issue.  I recommended that patient apply gauze or maxi pad to her right breast.  I discussed with her that if the dressing becomes very saturated, she should go ahead and change it.  I recommended that she continue with her compression at all times.  Patient expressed understanding.  In terms of her pain pump, I recommended she alternate Tylenol and ibuprofen throughout the day, every few hours or so.  I discussed with her that if her pain still does not feel controlled, she may take an oxycodone .  Patient expressed understanding.  Patient is on the schedule for tomorrow.  Will plan to further evaluate her at that time.  Patient was in agreement with the plan.

## 2024-02-18 ENCOUNTER — Encounter: Payer: Self-pay | Admitting: Student

## 2024-02-18 ENCOUNTER — Ambulatory Visit (INDEPENDENT_AMBULATORY_CARE_PROVIDER_SITE_OTHER): Admitting: Student

## 2024-02-18 VITALS — BP 130/88 | HR 90 | Ht 62.0 in | Wt 203.4 lb

## 2024-02-18 DIAGNOSIS — N62 Hypertrophy of breast: Secondary | ICD-10-CM

## 2024-02-18 NOTE — Progress Notes (Signed)
 Patient is a 19 year old female who recently underwent bilateral breast reduction with Dr. Orin Birk on 02/13/2024.  She is about 5 days postop.  She presents to the clinic today with concerns about drainage and pain.  Today, patient presents with her aunt at bedside.  She reports she is doing well.  She states that she is still having some burning pain at her right inframammary incision and soreness to her axilla bilaterally.  She denies any significant drainage overnight.  She denies any fevers or chills.  Denies any new issues since speaking on the phone yesterday.    Chaperone present on exam.  On exam, patient is sitting upright in no acute distress.  Breasts are soft and symmetric.  There is some mild swelling bilaterally in mild ecchymosis noted bilaterally.  No fluid collections are palpated on exam.  NAC's are healthy bilaterally.  Steri-Strips are in place over the incisions bilaterally.  Steri-Strips were removed to the right inframammary incision.  Incision appears to be intact and healing well.  There is no surrounding erythema.  No active drainage on exam.  There are no signs of infection on exam.  Area was cleaned with Vashe and new Steri-Strips were applied.  I have placed Mepilex border dressings over both of the incisions.  Patient states that she felt improvement with the burning after change of dressings.  I discussed with the patient that the soreness to her axilla is normal after liposuction.  Discussed with her that this should improve over the next few weeks.  Instructed patient to wear compression at all times.  Discussed with patient that she may cancel her appointment for later this week and then follow-up at her next scheduled appointment.  Discussed with her to call if she has any questions or concerns about anything before her next appointment.  She expressed understanding.

## 2024-02-21 ENCOUNTER — Encounter: Admitting: Surgical

## 2024-03-03 ENCOUNTER — Ambulatory Visit (INDEPENDENT_AMBULATORY_CARE_PROVIDER_SITE_OTHER): Admitting: Physician Assistant

## 2024-03-03 VITALS — BP 121/85 | HR 85

## 2024-03-03 DIAGNOSIS — Z9889 Other specified postprocedural states: Secondary | ICD-10-CM

## 2024-03-03 NOTE — Progress Notes (Signed)
 Patient is a pleasant 18 year old female s/p bilateral breast reduction performed 02/13/2024 by Dr. Lowery who presents to clinic for postoperative follow-up.  Reviewed operative report and 1150 g removed in the right breast, 1075 g removed from the left breast.  She was last seen here in clinic for initial postop 02/18/2024.  At that time, she expressed soreness in bilateral axilla likely attributable to the liposuction performed.  On exam, NAC's are healthy.  No active drainage.  Steri-Strips were replaced.  Follow-up already scheduled.  Today, patient is doing well.  She is accompanied by her sister-in-law at bedside.  She states that she still has some mild soreness in the axilla bilaterally, but nothing that is requiring any narcotic or even over-the-counter analgesics.  She also will have some sharp, fleeting discomfort in the upper breasts periodically, but manageable.  She is ambulatory, tolerating p.o. intake without difficulty.  Voiding.  Denies any leg swelling, chest pain, difficulty breathing, or fevers.  On exam, breasts have excellent shape and symmetry.  Soft throughout.  NAC's are healthy and viable, warm with good capillary refill.  Steri-Strips are carefully removed without complication or difficulty.  No significant incisional wounds appreciated on exam.  Surrounding skin and tissue appears healthy.  No palpable underlying fluid collections or areas of firmness.  Recommending continued activity modifications and compressive garments.  Patient tells me that she is exceedingly pleased with the outcome of her breast reduction surgery.  Follow-up as scheduled.  She can call the office should she have questions or concerns in interim.  Pictures will be needed at final visit.

## 2024-03-19 NOTE — Progress Notes (Signed)
 Patient is a pleasant 18 year old female s/p bilateral breast reduction performed 02/13/2024 by Dr. Lowery who presents to clinic for postoperative follow-up.   Reviewed operative report and 1150 g removed in the right breast, 1075 g removed from the left breast.  She was last seen here in clinic on 03/03/2024.  At that time, exam was entirely benign.  Continue with activity modifications and compressive garments.  Follow-up as scheduled.  Today, patient is doing well.  She continues to endorse occasional, fleeting discomfort in the right breast, but reports that it is less severe and less frequent than it was at last visit.  She again describes neuropathic pain.  Informed her that this will likely continue to lessen in severity and frequency as she gets further from surgery.  She also still has some upper back pain, but believes that she needs to get adjusted by chiropractor.  Overall, she does feel some improvement from her reduction.  She is eager to begin swimming and resuming normal activity.  On exam, breasts have excellent shape and symmetry.  Nipples are healthy.  Incisions fully healed throughout.  No residual sutures or skin glue.  No palpable areas of firmness or underlying fluid collections.  She has an excellent result from her reduction and appears entirely well-healed.  Recommending that she increase activity as tolerated and no ongoing restrictions.  She will avoid underwire bras.  She is already using a cream with good effect on scarring, but advised silicone scar gel twice daily x 3 months if she notices any scar hypertrophy or hyperpigmentation.  Follow-up only as needed.  Picture(s) obtained of the patient and placed in the chart were with the patient's or guardian's permission.

## 2024-03-20 ENCOUNTER — Ambulatory Visit (INDEPENDENT_AMBULATORY_CARE_PROVIDER_SITE_OTHER): Admitting: Physician Assistant

## 2024-03-20 DIAGNOSIS — Z9889 Other specified postprocedural states: Secondary | ICD-10-CM

## 2024-09-07 NOTE — H&P (Signed)
 PREOPERATIVE H&P  Chief Complaint: fracture of lateral malleolus of left ankle  HPI: Norma Brown is a 18 y.o. female who presents with a diagnosis of fracture of lateral malleolus of left ankle. Symptoms are rated as moderate to severe, and have been worsening.  This is significantly impairing activities of daily living.  She has elected for surgical management.   Past Medical History:  Diagnosis Date   ADHD (attention deficit hyperactivity disorder)    Anxiety    Bipolar disorder (HCC)    Depression    Past Surgical History:  Procedure Laterality Date   BREAST REDUCTION SURGERY Bilateral 02/13/2024   Procedure: BILATERAL BREAST REDUCTION WITH LIPOSUCTION;  Surgeon: Lowery Estefana RAMAN, DO;  Location: [facility];  Service: Plastics;  Laterality: Bilateral;   TONSILLECTOMY     TYMPANOSTOMY TUBE PLACEMENT     Social History   Socioeconomic History   Marital status: Single    Spouse name: Not on file   Number of children: Not on file   Years of education: Not on file   Highest education level: Not on file  Occupational History   Not on file  Tobacco Use   Smoking status: Never   Smokeless tobacco: Never  Vaping Use   Vaping status: Never Used  Substance and Sexual Activity   Alcohol use: Never   Drug use: Never   Sexual activity: Not Currently  Other Topics Concern   Not on file  Social History Narrative   Not on file   Social Drivers of Health   Tobacco Use: Low Risk (02/18/2024)   Patient History    Smoking Tobacco Use: Never    Smokeless Tobacco Use: Never    Passive Exposure: Not on file  Financial Resource Strain: Not on file  Food Insecurity: Not on file  Transportation Needs: Not on file  Physical Activity: Not on file  Stress: Not on file  Social Connections: Not on file  Depression (EYV7-0): Not on file  Alcohol Screen: Not on file  Housing: Not on file  Utilities: Not on file  Health Literacy: Not on file   Family History   Problem Relation Age of Onset   Drug abuse Mother    Bipolar disorder Mother    Drug abuse Father    Depression Maternal Uncle    Anxiety disorder Cousin    Anxiety disorder Cousin    Allergies[1] Prior to Admission medications  Medication Sig Start Date End Date Taking? Authorizing Provider  ondansetron  (ZOFRAN -ODT) 4 MG disintegrating tablet Take 1 tablet (4 mg total) by mouth every 8 (eight) hours as needed for nausea or vomiting. Patient not taking: Reported on 09/07/2024 02/13/24   Landy Honora CROME, PA-C  oxyCODONE  (ROXICODONE ) 5 MG immediate release tablet Take 1 tablet (5 mg total) by mouth every 8 (eight) hours as needed. Patient not taking: Reported on 03/20/2024 02/13/24 02/12/25  Landy Honora CROME, PA-C     Positive ROS: All other systems have been reviewed and were otherwise negative with the exception of those mentioned in the HPI and as above.  Physical Exam: General: Alert, no acute distress Cardiovascular: No pedal edema Respiratory: No cyanosis, no use of accessory musculature GI: No organomegaly, abdomen is soft and non-tender Skin: No lesions in the area of chief complaint Neurologic: Sensation intact distally Psychiatric: Patient is competent for consent with normal mood and affect Lymphatic: No axillary or cervical lymphadenopathy  MUSCULOSKELETAL: TTP lateral malleolus and surrounding ligaments, edema and ecchymosis present, limited ROM  d/t pain, + anterior drawer, + squeeze test, NVI   Imaging: 3v xrays of the left ankle show a spiral fracture of the distal fibula Weber B classification with widening of the lateral gutter   Assessment: fracture of lateral malleolus of left ankle  Plan: Plan for Procedures: OPEN REDUCTION INTERNAL FIXATION (ORIF) ANKLE FRACTURE  The risks benefits and alternatives were discussed with the patient including but not limited to the risks of nonoperative treatment, versus surgical intervention including infection, bleeding, nerve  injury,  blood clots, cardiopulmonary complications, morbidity, mortality, among others, and they were willing to proceed.   Weightbearing: NWB LLE Orthopedic devices: splint Showering: POD 3 Dressing: reinforce PRN Medicines: ASA, Oxy, Tylenol , Mobic , Zofran   Discharge: home     Meghan M Gawne, PA-C Office [phone] 09/07/2024 9:50 PM        [1]  Allergies Allergen Reactions   Acetaminophen -Caff-Pyrilamine Hives   Tape Rash    Surgical tape

## 2024-09-07 NOTE — Patient Instructions (Signed)
 SURGICAL WAITING ROOM VISITATION Patients having surgery or a procedure may have no more than 2 support people in the waiting area - these visitors may rotate.    Children under the age of 6 will not be allowed to visit due to the increase in respiratory illness  Children under the age of 70 must have an adult with them who is not the patient.  If the patient needs to stay at the hospital during part of their recovery, the visitor guidelines for inpatient rooms apply. Pre-op nurse will coordinate an appropriate time for 1 support person to accompany patient in pre-op.  This support person may not rotate.    Please refer to the [facility] website for the visitor guidelines for Inpatients (after your surgery is over and you are in a regular room).       Your procedure is scheduled on: 09-15-24   Report to [facility] Main Entrance    Report to admitting at 7:45 AM   Call this number if you have problems the morning of surgery [phone]   Do not eat food :After Midnight.   After Midnight you may have the following liquids until 7:00 AM DAY OF SURGERY  Water Non-Citrus Juices (without pulp, NO RED-Apple, White grape, White cranberry) Black Coffee (NO MILK/CREAM OR CREAMERS, sugar ok)  Clear Tea (NO MILK/CREAM OR CREAMERS, sugar ok) regular and decaf                             Plain Jell-O (NO RED)                                           Fruit ices (not with fruit pulp, NO RED)                                     Popsicles (NO RED)                                                               Sports drinks like Gatorade (NO RED)                   The day of surgery:  Drink ONE (1) Pre-Surgery Clear Ensure or G2 by 7:00 AM the morning of surgery. Drink in one sitting. Do not sip.  This drink was given to you during your hospital  pre-op appointment visit. Nothing else to drink after completing the Pre-Surgery Clear Ensure or G2.          If you have questions,  please contact your surgeons office.   FOLLOW ANY ADDITIONAL PRE OP INSTRUCTIONS YOU RECEIVED FROM YOUR SURGEON'S OFFICE!!!     Oral Hygiene is also important to reduce your risk of infection.                                    Remember - BRUSH YOUR TEETH THE MORNING OF SURGERY WITH YOUR REGULAR TOOTHPASTE   Do NOT smoke  after Midnight   Take these medicines the morning of surgery with A SIP OF WATER:   Stop all vitamins and herbal supplements 7 days before surgery                              You may not have any metal on your body including hair pins, jewelry, and body piercing             Do not wear make-up, lotions, powders, perfumes, or deodorant  Do not wear nail polish including gel and S&S, artificial/acrylic nails, or any other type of covering on natural nails including finger and toenails. If you have artificial nails, gel coating, etc. that needs to be removed by a nail salon please have this removed prior to surgery or surgery may need to be canceled/ delayed if the surgeon/ anesthesia feels like they are unable to be safely monitored.   Do not shave  48 hours prior to surgery.    Do not bring valuables to the hospital. [facility] IS NOT RESPONSIBLE   FOR VALUABLES.   Contacts, dentures or bridgework may not be worn into surgery.   DO NOT BRING YOUR HOME MEDICATIONS TO THE HOSPITAL. PHARMACY WILL DISPENSE MEDICATIONS LISTED ON YOUR MEDICATION LIST TO YOU DURING YOUR ADMISSION IN THE HOSPITAL!    Patients discharged on the day of surgery will not be allowed to drive home.  Someone NEEDS to stay with you for the first 24 hours after anesthesia.   Special Instructions: Bring a copy of your healthcare power of attorney and living will documents the day of surgery if you haven't scanned them before.              Please read over the following fact sheets you were given: IF YOU HAVE QUESTIONS ABOUT YOUR PRE-OP INSTRUCTIONS PLEASE CALL [phone] [place]  If you received a  COVID test during your pre-op visit  it is requested that you wear a mask when out in public, stay away from anyone that may not be feeling well and notify your surgeon if you develop symptoms. If you test positive for Covid or have been in contact with anyone that has tested positive in the last 10 days please notify you surgeon. [facility] - Preparing for Surgery Before surgery, you can play an important role.  Because skin is not sterile, your skin needs to be as free of germs as possible.  You can reduce the number of germs on your skin by washing with CHG (chlorahexidine gluconate) soap before surgery.  CHG is an antiseptic cleaner which kills germs and bonds with the skin to continue killing germs even after washing. Please DO NOT use if you have an allergy to CHG or antibacterial soaps.  If your skin becomes reddened/irritated stop using the CHG and inform your nurse when you arrive at Short Stay. Do not shave (including legs and underarms) for at least 48 hours prior to the first CHG shower.  You may shave your face/neck.  Please follow these instructions carefully:  1.  Shower with CHG Soap the night before surgery ONLY (DO NOT USE THE SOAP THE MORNING OF SURGERY).  2.  If you choose to wash your hair, wash your hair first as usual with your normal  shampoo.  3.  After you shampoo, rinse your hair and body thoroughly to remove the shampoo.  4.  Use CHG as you would any other liquid soap.  You can apply chg directly to the skin and wash.  Gently with a scrungie or clean washcloth.  5.  Apply the CHG Soap to your body ONLY FROM THE NECK DOWN.   Do   not use on face/ open                           Wound or open sores. Avoid contact with eyes, ears mouth and   genitals (private parts).                       Wash face,  Genitals (private parts) with your normal soap.             6.  Wash thoroughly, paying special attention to the area where your    surgery  will be  performed.  7.  Thoroughly rinse your body with warm water from the neck down.  8.  DO NOT shower/wash with your normal soap after using and rinsing off the CHG Soap.                9.  Pat yourself dry with a clean towel.            10.  Wear clean pajamas.            11.  Place clean sheets on your bed the night of your first shower and do not  sleep with pets. Day of Surgery : Do not apply any CHG, lotions/deodorants the morning of surgery.  Please wear clean clothes to the hospital/surgery center.  FAILURE TO FOLLOW THESE INSTRUCTIONS MAY RESULT IN THE CANCELLATION OF YOUR SURGERY  PATIENT SIGNATURE_________________________________  NURSE SIGNATURE__________________________________  ________________________________________________________________________

## 2024-09-08 ENCOUNTER — Encounter (HOSPITAL_COMMUNITY): Payer: Self-pay

## 2024-09-08 ENCOUNTER — Encounter (HOSPITAL_COMMUNITY)
Admission: RE | Admit: 2024-09-08 | Discharge: 2024-09-08 | Disposition: A | Discharge status: Home / Self Care | Source: Ambulatory Visit | Attending: Orthopedic Surgery | Admitting: Orthopedic Surgery

## 2024-09-08 ENCOUNTER — Other Ambulatory Visit: Payer: Self-pay

## 2024-09-08 VITALS — BP 132/94 | HR 82 | Temp 98.4°F | Resp 12 | Ht 63.0 in | Wt 201.2 lb

## 2024-09-08 DIAGNOSIS — Z01818 Encounter for other preprocedural examination: Secondary | ICD-10-CM | POA: Diagnosis present

## 2024-09-08 DIAGNOSIS — Z01812 Encounter for preprocedural laboratory examination: Secondary | ICD-10-CM | POA: Insufficient documentation

## 2024-09-08 HISTORY — DX: Unspecified asthma, uncomplicated: J45.909

## 2024-09-08 LAB — CBC
HCT: 45.1 % (ref 36.0–46.0)
Hemoglobin: 14.5 g/dL (ref 12.0–15.0)
MCH: 29.1 pg (ref 26.0–34.0)
MCHC: 32.2 g/dL (ref 30.0–36.0)
MCV: 90.4 fL (ref 80.0–100.0)
Platelets: 317 K/uL (ref 150–400)
RBC: 4.99 MIL/uL (ref 3.87–5.11)
RDW: 13 % (ref 11.5–15.5)
WBC: 5.8 K/uL (ref 4.0–10.5)
nRBC: 0 % (ref 0.0–0.2)

## 2024-09-08 NOTE — Progress Notes (Signed)
 Date of COVID positive in last 90 days:  No  PCP - Comer Raymond, PA-C Cardiologist - N/A  Chest x-ray - N/A EKG - N/A Stress Test - N/A ECHO - N/A Cardiac Cath - N/A Pacemaker/ICD device last checked:N/A Spinal Cord Stimulator:N/A  Bowel Prep - N/A  Sleep Study - N/A CPAP -   Fasting Blood Sugar - N/A Checks Blood Sugar _____ times a day  Last dose of GLP1 agonist-  N/A GLP1 instructions:  Do not take after     Last dose of SGLT-2 inhibitors-  N/A SGLT-2 instructions:  Do not take after    Blood Thinner Instructions: N/A Last dose:   Time: Aspirin Instructions:N/A Last Dose:  Activity level:  Can go up a flight of stairs and perform activities of daily living without stopping and without symptoms of chest pain or shortness of breath.  Anesthesia review: N/A  Patient denies shortness of breath, fever, cough and chest pain at PAT appointment  Patient verbalized understanding of instructions that were given to them at the PAT appointment. Patient was also instructed that they will need to review over the PAT instructions again at home before surgery.

## 2024-09-15 ENCOUNTER — Ambulatory Visit (HOSPITAL_COMMUNITY): Admitting: Anesthesiology

## 2024-09-15 ENCOUNTER — Ambulatory Visit (HOSPITAL_COMMUNITY)

## 2024-09-15 ENCOUNTER — Encounter (HOSPITAL_COMMUNITY): Payer: Self-pay | Admitting: Orthopedic Surgery

## 2024-09-15 ENCOUNTER — Ambulatory Visit (HOSPITAL_COMMUNITY)
Admission: RE | Admit: 2024-09-15 | Discharge: 2024-09-15 | Disposition: A | Discharge status: Home / Self Care | Payer: Self-pay | Attending: Orthopedic Surgery | Admitting: Orthopedic Surgery

## 2024-09-15 ENCOUNTER — Encounter: Admission: RE | Payer: Self-pay | Source: Home / Self Care

## 2024-09-15 ENCOUNTER — Other Ambulatory Visit: Payer: Self-pay

## 2024-09-15 DIAGNOSIS — X58XXXA Exposure to other specified factors, initial encounter: Secondary | ICD-10-CM | POA: Diagnosis not present

## 2024-09-15 DIAGNOSIS — F319 Bipolar disorder, unspecified: Secondary | ICD-10-CM | POA: Diagnosis not present

## 2024-09-15 DIAGNOSIS — S93432A Sprain of tibiofibular ligament of left ankle, initial encounter: Secondary | ICD-10-CM | POA: Insufficient documentation

## 2024-09-15 DIAGNOSIS — Z87891 Personal history of nicotine dependence: Secondary | ICD-10-CM | POA: Diagnosis not present

## 2024-09-15 DIAGNOSIS — S82842A Displaced bimalleolar fracture of left lower leg, initial encounter for closed fracture: Secondary | ICD-10-CM | POA: Insufficient documentation

## 2024-09-15 DIAGNOSIS — F909 Attention-deficit hyperactivity disorder, unspecified type: Secondary | ICD-10-CM | POA: Insufficient documentation

## 2024-09-15 DIAGNOSIS — S82832A Other fracture of upper and lower end of left fibula, initial encounter for closed fracture: Secondary | ICD-10-CM | POA: Diagnosis present

## 2024-09-15 DIAGNOSIS — J45909 Unspecified asthma, uncomplicated: Secondary | ICD-10-CM | POA: Insufficient documentation

## 2024-09-15 DIAGNOSIS — F4310 Post-traumatic stress disorder, unspecified: Secondary | ICD-10-CM | POA: Insufficient documentation

## 2024-09-15 DIAGNOSIS — Z01818 Encounter for other preprocedural examination: Secondary | ICD-10-CM

## 2024-09-15 HISTORY — PX: ORIF ANKLE FRACTURE: SHX5408

## 2024-09-15 LAB — POCT PREGNANCY, URINE: Preg Test, Ur: NEGATIVE

## 2024-09-15 SURGERY — OPEN REDUCTION INTERNAL FIXATION (ORIF) ANKLE FRACTURE
Anesthesia: Regional | Site: Ankle | Laterality: Left

## 2024-09-15 MED ORDER — PROPOFOL 10 MG/ML IV BOLUS
INTRAVENOUS | Status: AC
  Filled 2024-09-15: qty 20

## 2024-09-15 MED ORDER — FENTANYL CITRATE (PF) 50 MCG/ML IJ SOSY: 25.0000 ug | PREFILLED_SYRINGE | INTRAMUSCULAR | Status: DC | PRN

## 2024-09-15 MED ORDER — LIDOCAINE HCL (CARDIAC) PF 100 MG/5ML IV SOSY
PREFILLED_SYRINGE | INTRAVENOUS | Status: DC | PRN
  Administered 2024-09-15: 40 mg via INTRAVENOUS

## 2024-09-15 MED ORDER — DEXAMETHASONE SOD PHOSPHATE PF 10 MG/ML IJ SOLN
INTRAMUSCULAR | Status: DC | PRN
  Administered 2024-09-15: 8 mg via INTRAVENOUS

## 2024-09-15 MED ORDER — 0.9 % SODIUM CHLORIDE (POUR BTL) OPTIME
TOPICAL | Status: DC | PRN
  Administered 2024-09-15: 1000 mL

## 2024-09-15 MED ORDER — OXYCODONE HCL 5 MG PO TABS: 5.0000 mg | ORAL_TABLET | Freq: Three times a day (TID) | ORAL | 0 refills | Status: AC | PRN

## 2024-09-15 MED ORDER — FENTANYL CITRATE (PF) 50 MCG/ML IJ SOSY
50.0000 ug | PREFILLED_SYRINGE | Freq: Once | INTRAMUSCULAR | Status: AC
  Administered 2024-09-15: 50 ug via INTRAVENOUS
  Filled 2024-09-15: qty 2

## 2024-09-15 MED ORDER — ACETAMINOPHEN 500 MG PO TABS: 1000.0000 mg | ORAL_TABLET | Freq: Four times a day (QID) | ORAL | 0 refills | Status: AC | PRN

## 2024-09-15 MED ORDER — ASPIRIN 81 MG PO TBEC: 81.0000 mg | DELAYED_RELEASE_TABLET | Freq: Two times a day (BID) | ORAL | 0 refills | Status: AC

## 2024-09-15 MED ORDER — DEXMEDETOMIDINE HCL IN NACL 80 MCG/20ML IV SOLN
INTRAVENOUS | Status: DC | PRN
  Administered 2024-09-15 (×2): 8 ug via INTRAVENOUS

## 2024-09-15 MED ORDER — VANCOMYCIN HCL 1000 MG IV SOLR
INTRAVENOUS | Status: AC
  Filled 2024-09-15: qty 20

## 2024-09-15 MED ORDER — LIDOCAINE HCL (PF) 2 % IJ SOLN
INTRAMUSCULAR | Status: AC
  Filled 2024-09-15: qty 5

## 2024-09-15 MED ORDER — MIDAZOLAM HCL (PF) 2 MG/2ML IJ SOLN
1.0000 mg | Freq: Once | INTRAMUSCULAR | Status: AC
  Administered 2024-09-15: 2 mg via INTRAVENOUS
  Filled 2024-09-15: qty 2

## 2024-09-15 MED ORDER — BUPIVACAINE-EPINEPHRINE (PF) 0.5% -1:200000 IJ SOLN
INTRAMUSCULAR | Status: DC | PRN
  Administered 2024-09-15: 30 mL via PERINEURAL

## 2024-09-15 MED ORDER — LACTATED RINGERS IV SOLN: INTRAVENOUS | Status: DC

## 2024-09-15 MED ORDER — POVIDONE-IODINE 10 % EX SWAB
2.0000 | Freq: Once | CUTANEOUS | Status: AC
  Administered 2024-09-15: 2 via TOPICAL

## 2024-09-15 MED ORDER — CEFAZOLIN SODIUM-DEXTROSE 2-4 GM/100ML-% IV SOLN
2.0000 g | INTRAVENOUS | Status: AC
  Administered 2024-09-15: 2 g via INTRAVENOUS
  Filled 2024-09-15: qty 100

## 2024-09-15 MED ORDER — OXYCODONE HCL 5 MG/5ML PO SOLN: 5.0000 mg | Freq: Once | ORAL | Status: DC | PRN

## 2024-09-15 MED ORDER — ONDANSETRON HCL 4 MG/2ML IJ SOLN
INTRAMUSCULAR | Status: AC
  Filled 2024-09-15: qty 2

## 2024-09-15 MED ORDER — ONDANSETRON HCL 4 MG/2ML IJ SOLN
INTRAMUSCULAR | Status: DC | PRN
  Administered 2024-09-15: 4 mg via INTRAVENOUS

## 2024-09-15 MED ORDER — ONDANSETRON 4 MG PO TBDP: 4.0000 mg | ORAL_TABLET | Freq: Three times a day (TID) | ORAL | 0 refills | Status: AC | PRN

## 2024-09-15 MED ORDER — ACETAMINOPHEN 500 MG PO TABS
1000.0000 mg | ORAL_TABLET | Freq: Once | ORAL | Status: AC
  Administered 2024-09-15: 1000 mg via ORAL
  Filled 2024-09-15: qty 2

## 2024-09-15 MED ORDER — ORAL CARE MOUTH RINSE: 15.0000 mL | Freq: Once | OROMUCOSAL | Status: AC

## 2024-09-15 MED ORDER — AMISULPRIDE (ANTIEMETIC) 5 MG/2ML IV SOLN: 10.0000 mg | Freq: Once | INTRAVENOUS | Status: DC | PRN

## 2024-09-15 MED ORDER — MELOXICAM 15 MG PO TABS: 15.0000 mg | ORAL_TABLET | Freq: Every day | ORAL | 0 refills | Status: AC | PRN

## 2024-09-15 MED ORDER — PROPOFOL 10 MG/ML IV BOLUS
INTRAVENOUS | Status: DC | PRN
  Administered 2024-09-15: 200 mg via INTRAVENOUS

## 2024-09-15 MED ORDER — BUPIVACAINE HCL (PF) 0.5 % IJ SOLN
INTRAMUSCULAR | Status: AC
  Filled 2024-09-15: qty 30

## 2024-09-15 MED ORDER — CHLORHEXIDINE GLUCONATE 0.12 % MT SOLN
15.0000 mL | Freq: Once | OROMUCOSAL | Status: AC
  Administered 2024-09-15: 15 mL via OROMUCOSAL

## 2024-09-15 MED ORDER — OXYCODONE HCL 5 MG PO TABS: 5.0000 mg | ORAL_TABLET | Freq: Once | ORAL | Status: DC | PRN

## 2024-09-15 SURGICAL SUPPLY — 56 items
BAG COUNTER SPONGE SURGICOUNT (BAG) ×1 IMPLANT
BAG ZIPLOCK 12X15 (MISCELLANEOUS) ×1 IMPLANT
BIT DRILL 2.5 CANN LNG (BIT) IMPLANT
BIT DRILL 3.5 CANN STRL (BIT) IMPLANT
BLADE SURG 15 STRL LF DISP TIS (BLADE) ×1 IMPLANT
BNDG COHESIVE 6X5 TAN ST LF (GAUZE/BANDAGES/DRESSINGS) ×1 IMPLANT
BNDG COMPR ESMARK 6X3 LF (GAUZE/BANDAGES/DRESSINGS) ×1 IMPLANT
BNDG ELASTIC 4INX 5YD STR LF (GAUZE/BANDAGES/DRESSINGS) ×1 IMPLANT
BNDG ELASTIC 6INX 5YD STR LF (GAUZE/BANDAGES/DRESSINGS) ×1 IMPLANT
CHLORAPREP W/TINT 26 (MISCELLANEOUS) ×1 IMPLANT
CLSR STERI-STRIP ANTIMIC 1/2X4 (GAUZE/BANDAGES/DRESSINGS) IMPLANT
COVER MAYO STAND STRL (DRAPES) ×1 IMPLANT
COVER SURGICAL LIGHT HANDLE (MISCELLANEOUS) ×1 IMPLANT
CUFF TRNQT CYL 34X4.125X (TOURNIQUET CUFF) ×1 IMPLANT
DRAPE C-ARM 42X120 X-RAY (DRAPES) IMPLANT
DRAPE U-SHAPE 47X51 STRL (DRAPES) ×1 IMPLANT
DRSG EMULSION OIL 3X3 NADH (GAUZE/BANDAGES/DRESSINGS) IMPLANT
ELECT REM PT RETURN 15FT ADLT (MISCELLANEOUS) ×1 IMPLANT
GAUZE PAD ABD 8X10 STRL (GAUZE/BANDAGES/DRESSINGS) ×2 IMPLANT
GAUZE SPONGE 4X4 12PLY STRL (GAUZE/BANDAGES/DRESSINGS) ×1 IMPLANT
GLOVE BIO SURGEON STRL SZ7.5 (GLOVE) ×1 IMPLANT
GLOVE BIOGEL PI IND STRL 7.0 (GLOVE) ×1 IMPLANT
GLOVE BIOGEL PI IND STRL 8 (GLOVE) ×1 IMPLANT
GLOVE SURG SYN 6.5 PF PI (GLOVE) ×1 IMPLANT
GOWN STRL REUS W/ TWL LRG LVL3 (GOWN DISPOSABLE) ×1 IMPLANT
GOWN STRL REUS W/ TWL XL LVL3 (GOWN DISPOSABLE) ×1 IMPLANT
KIT BASIN OR (CUSTOM PROCEDURE TRAY) ×1 IMPLANT
KIT TURNOVER KIT A (KITS) ×1 IMPLANT
MANIFOLD NEPTUNE II (INSTRUMENTS) ×1 IMPLANT
NEEDLE HYPO 22X1.5 SAFETY MO (MISCELLANEOUS) ×1 IMPLANT
NS IRRIG 1000ML POUR BTL (IV SOLUTION) ×1 IMPLANT
PACK ORTHO EXTREMITY (CUSTOM PROCEDURE TRAY) ×1 IMPLANT
PAD CAST 4YDX4 CTTN HI CHSV (CAST SUPPLIES) ×1 IMPLANT
PADDING CAST ABS COTTON 4X4 ST (CAST SUPPLIES) IMPLANT
PADDING CAST COTTON 6X4 STRL (CAST SUPPLIES) ×1 IMPLANT
PLATE LOCK THIRD TI 7H TUBULAR (Plate) IMPLANT
PROTECTOR NERVE ULNAR (MISCELLANEOUS) ×1 IMPLANT
SCREW CANC TI FT ANKLE 4X14 (Screw) IMPLANT
SCREW CORT 3.5X18 THRD (Screw) IMPLANT
SCREW CORT FT LP 3.5X10 (Screw) IMPLANT
SCREW LP 3.5X12MM (Screw) IMPLANT
SCREW LP TI 3.5X14MM (Screw) IMPLANT
SPIKE FLUID TRANSFER (MISCELLANEOUS) IMPLANT
SPLINT PLASTER CAST XFAST 5X30 (CAST SUPPLIES) ×20 IMPLANT
STRAP ANKLE DISTRACTOR (MISCELLANEOUS) ×1 IMPLANT
SUCTION TUBE FRAZIER 10FR DISP (SUCTIONS) ×1 IMPLANT
SUT ETHILON 3 0 PS 1 (SUTURE) IMPLANT
SUT MON AB 2-0 CT1 36 (SUTURE) IMPLANT
SUT MON AB 3-0 SH27 (SUTURE) IMPLANT
SUT VIC AB 0 CT1 36 (SUTURE) ×2 IMPLANT
SUT VIC AB 2-0 SH 27XBRD (SUTURE) ×1 IMPLANT
SYNDESMOSIS TIGHTROPE XP (Orthopedic Implant) IMPLANT
SYR CONTROL 10ML LL (SYRINGE) ×1 IMPLANT
TOWEL OR DSP ST BLU DLX 10/PK (DISPOSABLE) ×1 IMPLANT
TUBING CONNECTING 10 (TUBING) ×1 IMPLANT
UNDERPAD 30X36 HEAVY ABSORB (UNDERPADS AND DIAPERS) ×1 IMPLANT

## 2024-09-15 NOTE — Anesthesia Procedure Notes (Signed)
 Procedure Name: LMA Insertion Date/Time: 09/15/2024 10:34 AM  Performed by: Gladis Honey, CRNAPre-anesthesia Checklist: Patient identified, Emergency Drugs available, Suction available and Patient being monitored Patient Re-evaluated:Patient Re-evaluated prior to induction Oxygen Delivery Method: Circle System Utilized Preoxygenation: Pre-oxygenation with 100% oxygen Induction Type: IV induction Ventilation: Mask ventilation without difficulty LMA: LMA inserted LMA Size: 4.0 Number of attempts: 1 Airway Equipment and Method: Bite block Placement Confirmation: positive ETCO2 Tube secured with: Tape Dental Injury: Teeth and Oropharynx as per pre-operative assessment

## 2024-09-15 NOTE — Interval H&P Note (Signed)
 History and Physical Interval Note:  09/15/2024 8:38 AM  Norma Brown  has presented today for surgery, with the diagnosis of fracture of malleolus of left ankle.  The various methods of treatment have been discussed with the patient and family. After consideration of risks, benefits and other options for treatment, the patient has consented to  Procedures: OPEN REDUCTION INTERNAL FIXATION (ORIF) ANKLE FRACTURE (Left) as a surgical intervention.  The patient's history has been reviewed, patient examined, no change in status, stable for surgery.  I have reviewed the patient's chart and labs.  Questions were answered to the patient's satisfaction.     Norma Brown

## 2024-09-15 NOTE — Discharge Instructions (Addendum)

## 2024-09-15 NOTE — Transfer of Care (Signed)
 Immediate Anesthesia Transfer of Care Note  Patient: Norma Brown  Procedure(s) Performed: OPEN REDUCTION INTERNAL FIXATION (ORIF) ANKLE FRACTURE (Left: Ankle)  Patient Location: PACU  Anesthesia Type:GA combined with regional for post-op pain  Level of Consciousness: awake, alert , and oriented  Airway & Oxygen Therapy: Patient Spontanous Breathing and Patient connected to face mask oxygen  Post-op Assessment: Report given to RN and Post -op Vital signs reviewed and stable  Post vital signs: Reviewed and stable  Last Vitals:  Vitals Value Taken Time  BP 103/65 09/15/24 11:23  Temp    Pulse 84 09/15/24 11:27  Resp 15 09/15/24 11:27  SpO2 100 % 09/15/24 11:27  Vitals shown include unfiled device data.  Last Pain:  Vitals:   09/15/24 0821  TempSrc:   PainSc: 0-No pain      Patients Stated Pain Goal: 5 (09/15/24 0810)  Complications: No notable events documented.

## 2024-09-15 NOTE — Anesthesia Preprocedure Evaluation (Addendum)
"                                    Anesthesia Evaluation  Patient identified by MRN, date of birth, ID band Patient awake    Reviewed: Allergy & Precautions, NPO status , Patient's Chart, lab work & pertinent test results  Airway Mallampati: II  TM Distance: >3 FB Neck ROM: Full    Dental no notable dental hx.    Pulmonary asthma , former smoker   Pulmonary exam normal        Cardiovascular negative cardio ROS Normal cardiovascular exam     Neuro/Psych  PSYCHIATRIC DISORDERS Anxiety Depression Bipolar Disorder   PTSD (post-traumatic stress disorder)negative neurological ROS     GI/Hepatic negative GI ROS, Neg liver ROS,,,  Endo/Other  negative endocrine ROS    Renal/GU negative Renal ROS     Musculoskeletal   Abdominal  (+) + obese  Peds  (+) ADHD Hematology negative hematology ROS (+)   Anesthesia Other Findings fracture of malleolus of left ankle  Reproductive/Obstetrics Hcg negative                              Anesthesia Physical Anesthesia Plan  ASA: 2  Anesthesia Plan: General and Regional   Post-op Pain Management: Regional block*   Induction: Intravenous  PONV Risk Score and Plan: 3 and Ondansetron , Dexamethasone , Midazolam  and Treatment may vary due to age or medical condition  Airway Management Planned: LMA  Additional Equipment:   Intra-op Plan:   Post-operative Plan: Extubation in OR  Informed Consent: I have reviewed the patients History and Physical, chart, labs and discussed the procedure including the risks, benefits and alternatives for the proposed anesthesia with the patient or authorized representative who has indicated his/her understanding and acceptance.     Dental advisory given  Plan Discussed with: CRNA  Anesthesia Plan Comments:          Anesthesia Quick Evaluation  "

## 2024-09-15 NOTE — Op Note (Signed)
 09/15/2024  1:08 PM  PATIENT:  Norma Brown    PRE-OPERATIVE DIAGNOSIS:  fracture of malleolus of left ankle and ligament injury at the medial malleolus with syndesmosis injury  POST-OPERATIVE DIAGNOSIS:  Same  PROCEDURE:  OPEN REDUCTION INTERNAL FIXATION (ORIF) ANKLE FRACTURE by mall equivalent fracture also ORIF syndesmosis  SURGEON:  Evalene JONETTA Chancy, MD  ASSISTANT: Gerard Large, PA-C, he was present and scrubbed throughout the case, critical for completion in a timely fashion, and for retraction, instrumentation, and closure.   ANESTHESIA:   General Plus block  PREOPERATIVE INDICATIONS:  Norma Brown is a  19 y.o. female with a diagnosis of fracture of malleolus of left ankle who failed conservative measures and elected for surgical management.    The risks benefits and alternatives were discussed with the patient preoperatively including but not limited to the risks of infection, bleeding, nerve injury, cardiopulmonary complications, the need for revision surgery, among others, and the patient was willing to proceed.  OPERATIVE IMPLANTS: Arthrex plate and tight rope  OPERATIVE FINDINGS: Unstable ankle fracture. Stable syndesmosis post op  BLOOD LOSS: min  COMPLICATIONS: none  TOURNIQUET TIME: 45 minutes  OPERATIVE PROCEDURE:  Patient was identified in the preoperative holding area and site was marked by me He was transported to the operating theater and placed on the table in supine position taking care to pad all bony prominences. After a preincinduction time out anesthesia was induced. The left lower extremity was prepped and draped in normal sterile fashion and a pre-incision timeout was performed. Norma Brown received Ancef  for preoperative antibiotics.   I made a lateral incision of roughly 7 cm dissection was carried down sharply to the distal fibula and then spreading dissection was used proximally to protect the superficial peroneal nerve. I sharply incised the  periosteum and took care to protect the peroneal tendons. I then debrided the fracture site and performed a reduction maneuver which was held in place with a clamp.   I placed a lag screw across the fracture  I then selected a 7-hole one third tubular plate and placed in a neutralization fashion care was taken distally so as not to penetrate the joint with the cancellus screws.  This plate helped to stabilize the medial aspect of the by mall equivalent fracture as well   I then stressed the syndesmosis and it was unstable for syndesmotic fixation I performed a reduction maneuver with a and placed a tightrope  I then took 4+ x-rays including the stress x-ray previously mentioned these were all taken by me and reviewed by me  This showed stable fixation of the by mall equivalent ankle fracture and syndesmosis  The wound was then thoroughly irrigated and closed using a 0 Vicryl and absorbable Monocryl sutures. He was placed in a short leg splint.   POST OPERATIVE PLAN: Non-weightbearing. DVT prophylaxis will consist of mobilization and chemical px

## 2024-09-15 NOTE — Anesthesia Procedure Notes (Signed)
 Anesthesia Regional Block: Popliteal block   Pre-Anesthetic Checklist: , timeout performed,  Correct Patient, Correct Site, Correct Laterality,  Correct Procedure, Correct Position, site marked,  Risks and benefits discussed,  Surgical consent,  Pre-op evaluation,  At surgeon's request and post-op pain management  Laterality: Left  Prep: chloraprep       Needles:  Injection technique: Single-shot  Needle Type: Echogenic Stimulator Needle     Needle Length: 10cm  Needle Gauge: 20     Additional Needles:   Procedures:,,,, ultrasound used (permanent image in chart),,    Narrative:  Start time: 09/15/2024 9:05 AM End time: 09/15/2024 9:15 AM Injection made incrementally with aspirations every 5 mL.  Performed by: Personally  Anesthesiologist: Patrisha Bernardino SQUIBB, MD  Additional Notes: Functioning IV was confirmed and monitors were applied.  A timeout was performed. Sterile prep, hand hygiene and sterile gloves were used. A 20ga Bbraun echogenic stimulator needle was used. Negative aspiration and negative test dose prior to incremental administration of local anesthetic. The patient tolerated the procedure well.  Ultrasound guidance: relevent anatomy identified, needle position confirmed, local anesthetic spread visualized around nerve(s), vascular puncture avoided.  Image printed for medical record.

## 2024-09-16 ENCOUNTER — Encounter (HOSPITAL_COMMUNITY): Payer: Self-pay | Admitting: Orthopedic Surgery

## 2024-09-16 NOTE — Anesthesia Postprocedure Evaluation (Signed)
"   Anesthesia Post Note  Patient: Norma Brown  Procedure(s) Performed: OPEN REDUCTION INTERNAL FIXATION (ORIF) ANKLE FRACTURE (Left: Ankle)     Patient location during evaluation: PACU Anesthesia Type: Regional and General Level of consciousness: awake Pain management: pain level controlled Vital Signs Assessment: post-procedure vital signs reviewed and stable Respiratory status: spontaneous breathing, nonlabored ventilation and respiratory function stable Cardiovascular status: blood pressure returned to baseline and stable Postop Assessment: no apparent nausea or vomiting Anesthetic complications: no   No notable events documented.  Last Vitals:  Vitals:   09/15/24 1200 09/15/24 1215  BP: 108/67 100/65  Pulse: 88 64  Resp: 18 14  Temp: (!) 36.1 C   SpO2: 100% 100%    Last Pain:  Vitals:   09/15/24 1215  TempSrc:   PainSc: 0-No pain                 Kambrey Hagger P Kortnee Bas      "
